# Patient Record
Sex: Female | Born: 1985 | Race: Black or African American | Hispanic: No | Marital: Single | Smoking: Never smoker
Health system: Southern US, Community
[De-identification: ages and names within clinical notes are randomized; demographics above are authoritative.]

## PROBLEM LIST (undated history)

## (undated) DIAGNOSIS — I639 Cerebral infarction, unspecified: Secondary | ICD-10-CM

---
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
## 2008-04-08 ENCOUNTER — Emergency Department (HOSPITAL_COMMUNITY): Admission: EM | Admit: 2008-04-08 | Discharge: 2008-04-08 | Payer: Self-pay | Admitting: Emergency Medicine

## 2008-05-12 ENCOUNTER — Emergency Department (HOSPITAL_COMMUNITY): Admission: EM | Admit: 2008-05-12 | Discharge: 2008-05-12 | Payer: Self-pay | Admitting: Emergency Medicine

## 2009-02-21 ENCOUNTER — Emergency Department (HOSPITAL_COMMUNITY): Admission: EM | Admit: 2009-02-21 | Discharge: 2009-02-21 | Payer: Self-pay | Admitting: Family Medicine

## 2010-09-09 ENCOUNTER — Emergency Department (HOSPITAL_COMMUNITY): Admission: EM | Admit: 2010-09-09 | Discharge: 2010-09-09 | Payer: Self-pay | Admitting: Family Medicine

## 2011-01-20 LAB — CULTURE, ROUTINE-ABSCESS

## 2011-08-05 LAB — POCT PREGNANCY, URINE
Operator id: 29452
Preg Test, Ur: NEGATIVE

## 2011-08-05 LAB — GC/CHLAMYDIA PROBE AMP, GENITAL
Chlamydia, DNA Probe: NEGATIVE
GC Probe Amp, Genital: NEGATIVE

## 2011-08-05 LAB — WET PREP, GENITAL
Trich, Wet Prep: NONE SEEN
Yeast Wet Prep HPF POC: NONE SEEN

## 2011-08-05 LAB — RPR: RPR Ser Ql: NONREACTIVE

## 2011-08-05 LAB — HERPES SIMPLEX VIRUS CULTURE

## 2012-01-18 ENCOUNTER — Emergency Department (HOSPITAL_COMMUNITY): Admission: EM | Admit: 2012-01-18 | Discharge: 2012-01-18 | Discharge status: Against Medical Advice | Payer: Self-pay

## 2014-01-14 ENCOUNTER — Inpatient Hospital Stay (HOSPITAL_COMMUNITY)
Admission: AD | Admit: 2014-01-14 | Discharge: 2014-01-14 | Disposition: A | Discharge status: Home / Self Care | Payer: BC Managed Care – PPO | Source: Ambulatory Visit | Attending: Obstetrics & Gynecology | Admitting: Obstetrics & Gynecology

## 2014-01-14 ENCOUNTER — Encounter (HOSPITAL_COMMUNITY): Payer: Self-pay

## 2014-01-14 DIAGNOSIS — T8332XA Displacement of intrauterine contraceptive device, initial encounter: Secondary | ICD-10-CM

## 2014-01-14 DIAGNOSIS — N390 Urinary tract infection, site not specified: Secondary | ICD-10-CM | POA: Insufficient documentation

## 2014-01-14 LAB — URINALYSIS, ROUTINE W REFLEX MICROSCOPIC
BILIRUBIN URINE: NEGATIVE
Glucose, UA: NEGATIVE mg/dL
Ketones, ur: NEGATIVE mg/dL
NITRITE: NEGATIVE
PROTEIN: NEGATIVE mg/dL
SPECIFIC GRAVITY, URINE: 1.02 (ref 1.005–1.030)
UROBILINOGEN UA: 0.2 mg/dL (ref 0.0–1.0)
pH: 6.5 (ref 5.0–8.0)

## 2014-01-14 LAB — POCT PREGNANCY, URINE: Preg Test, Ur: NEGATIVE

## 2014-01-14 LAB — URINE MICROSCOPIC-ADD ON

## 2014-01-14 MED ORDER — PHENAZOPYRIDINE HCL 200 MG PO TABS: 200.0000 mg | ORAL_TABLET | Freq: Three times a day (TID) | ORAL | Status: DC | PRN

## 2014-01-14 MED ORDER — CIPROFLOXACIN HCL 500 MG PO TABS: 500.0000 mg | ORAL_TABLET | Freq: Two times a day (BID) | ORAL | Status: DC

## 2014-01-14 NOTE — MAU Note (Addendum)
Pt presents complaining of painful urination and frequency that started last week and got worse yesterday. Denies vaginal bleeding or discharge. States she feels like her IUD has moved because she cannot feel the strings

## 2014-01-14 NOTE — Discharge Instructions (Signed)
Urinary Tract Infection Urinary tract infections (UTIs) can develop anywhere along your urinary tract. Your urinary tract is your body's drainage system for removing wastes and extra water. Your urinary tract includes two kidneys, two ureters, a bladder, and a urethra. Your kidneys are a pair of bean-shaped organs. Each kidney is about the size of your fist. They are located below your ribs, one on each side of your spine. CAUSES Infections are caused by microbes, which are microscopic organisms, including fungi, viruses, and bacteria. These organisms are so small that they can only be seen through a microscope. Bacteria are the microbes that most commonly cause UTIs. SYMPTOMS  Symptoms of UTIs may vary by age and gender of the patient and by the location of the infection. Symptoms in young women typically include a frequent and intense urge to urinate and a painful, burning feeling in the bladder or urethra during urination. Older women and men are more likely to be tired, shaky, and weak and have muscle aches and abdominal pain. A fever may mean the infection is in your kidneys. Other symptoms of a kidney infection include pain in your back or sides below the ribs, nausea, and vomiting. DIAGNOSIS To diagnose a UTI, your caregiver will ask you about your symptoms. Your caregiver also will ask to provide a urine sample. The urine sample will be tested for bacteria and white blood cells. White blood cells are made by your body to help fight infection. TREATMENT  Typically, UTIs can be treated with medication. Because most UTIs are caused by a bacterial infection, they usually can be treated with the use of antibiotics. The choice of antibiotic and length of treatment depend on your symptoms and the type of bacteria causing your infection. HOME CARE INSTRUCTIONS  If you were prescribed antibiotics, take them exactly as your caregiver instructs you. Finish the medication even if you feel better after you  have only taken some of the medication.  Drink enough water and fluids to keep your urine clear or pale yellow.  Avoid caffeine, tea, and carbonated beverages. They tend to irritate your bladder.  Empty your bladder often. Avoid holding urine for long periods of time.  Empty your bladder before and after sexual intercourse.  After a bowel movement, women should cleanse from front to back. Use each tissue only once. SEEK MEDICAL CARE IF:   You have back pain.  You develop a fever.  Your symptoms do not begin to resolve within 3 days. SEEK IMMEDIATE MEDICAL CARE IF:   You have severe back pain or lower abdominal pain.  You develop chills.  You have nausea or vomiting.  You have continued burning or discomfort with urination. MAKE SURE YOU:   Understand these instructions.  Will watch your condition.  Will get help right away if you are not doing well or get worse. Document Released: 08/05/2005 Document Revised: 04/26/2012 Document Reviewed: 12/04/2011 [facility] Patient Information 2014 [place], [place].  Intrauterine Device Information An intrauterine device (IUD) is inserted into your uterus to prevent pregnancy. There are two types of IUDs available:   Copper IUD This type of IUD is wrapped in copper wire and is placed inside the uterus. Copper makes the uterus and fallopian tubes produce a fluid that kills sperm. The copper IUD can stay in place for 10 years.  Hormone IUD This type of IUD contains the hormone progestin (synthetic progesterone). The hormone thickens the cervical mucus and prevents sperm from entering the uterus. It also thins the uterine  lining to prevent implantation of a fertilized egg. The hormone can weaken or kill the sperm that get into the uterus. One type of hormone IUD can stay in place for 5 years, and another type can stay in place for 3 years. Your health care provider will make sure you are a good candidate for a contraceptive IUD. Discuss with  your health care provider the possible side effects.  ADVANTAGES OF AN INTRAUTERINE DEVICE  IUDs are highly effective, reversible, long acting, and low maintenance.   There are no estrogen-related side effects.   An IUD can be used when breastfeeding.   IUDs are not associated with weight gain.   The copper IUD works immediately after insertion.   The hormone IUD works right away if inserted within 7 days of your period starting. You will need to use a backup method of birth control for 7 days if the hormone IUD is inserted at any other time in your cycle.  The copper IUD does not interfere with your female hormones.   The hormone IUD can make heavy menstrual periods lighter and decrease cramping.   The hormone IUD can be used for 3 or 5 years.   The copper IUD can be used for 10 years. DISADVANTAGES OF AN INTRAUTERINE DEVICE  The hormone IUD can be associated with irregular bleeding patterns.   The copper IUD can make your menstrual flow heavier and more painful.   You may experience cramping and vaginal bleeding after insertion.  Document Released: 09/29/2004 Document Revised: 06/28/2013 Document Reviewed: 04/16/2013 [facility] Patient Information 2014 [place]
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
## 2014-01-22 ENCOUNTER — Emergency Department (HOSPITAL_COMMUNITY)
Admission: EM | Admit: 2014-01-22 | Discharge: 2014-01-23 | Disposition: A | Discharge status: Home / Self Care | Payer: BC Managed Care – PPO | Attending: Emergency Medicine | Admitting: Emergency Medicine

## 2014-01-22 ENCOUNTER — Encounter (HOSPITAL_COMMUNITY): Payer: Self-pay | Admitting: Emergency Medicine

## 2014-01-22 DIAGNOSIS — Z3202 Encounter for pregnancy test, result negative: Secondary | ICD-10-CM | POA: Insufficient documentation

## 2014-01-22 DIAGNOSIS — K529 Noninfective gastroenteritis and colitis, unspecified: Secondary | ICD-10-CM

## 2014-01-22 DIAGNOSIS — K5289 Other specified noninfective gastroenteritis and colitis: Secondary | ICD-10-CM | POA: Insufficient documentation

## 2014-01-22 DIAGNOSIS — Z87440 Personal history of urinary (tract) infections: Secondary | ICD-10-CM | POA: Insufficient documentation

## 2014-01-22 LAB — CBC WITH DIFFERENTIAL/PLATELET
Basophils Absolute: 0 10*3/uL (ref 0.0–0.1)
Basophils Relative: 0 % (ref 0–1)
Eosinophils Absolute: 0.1 10*3/uL (ref 0.0–0.7)
Eosinophils Relative: 1 % (ref 0–5)
HCT: 42.7 % (ref 36.0–46.0)
Hemoglobin: 14.4 g/dL (ref 12.0–15.0)
Lymphocytes Relative: 18 % (ref 12–46)
Lymphs Abs: 1.4 10*3/uL (ref 0.7–4.0)
MCH: 29.6 pg (ref 26.0–34.0)
MCHC: 33.7 g/dL (ref 30.0–36.0)
MCV: 87.7 fL (ref 78.0–100.0)
Monocytes Absolute: 0.6 10*3/uL (ref 0.1–1.0)
Monocytes Relative: 8 % (ref 3–12)
Neutro Abs: 5.7 10*3/uL (ref 1.7–7.7)
Neutrophils Relative %: 74 % (ref 43–77)
Platelets: 296 10*3/uL (ref 150–400)
RBC: 4.87 MIL/uL (ref 3.87–5.11)
RDW: 12.7 % (ref 11.5–15.5)
WBC: 7.7 10*3/uL (ref 4.0–10.5)

## 2014-01-22 LAB — COMPREHENSIVE METABOLIC PANEL
ALT: 14 U/L (ref 0–35)
AST: 20 U/L (ref 0–37)
Albumin: 3.6 g/dL (ref 3.5–5.2)
Alkaline Phosphatase: 72 U/L (ref 39–117)
BUN: 10 mg/dL (ref 6–23)
CO2: 21 mEq/L (ref 19–32)
Calcium: 9.3 mg/dL (ref 8.4–10.5)
Chloride: 100 mEq/L (ref 96–112)
Creatinine, Ser: 1.14 mg/dL — ABNORMAL HIGH (ref 0.50–1.10)
GFR calc Af Amer: 75 mL/min — ABNORMAL LOW (ref >90)
GFR calc non Af Amer: 65 mL/min — ABNORMAL LOW (ref >90)
Glucose, Bld: 94 mg/dL (ref 70–99)
Potassium: 3.5 mEq/L — ABNORMAL LOW (ref 3.7–5.3)
Sodium: 136 mEq/L — ABNORMAL LOW (ref 137–147)
Total Bilirubin: 0.5 mg/dL (ref 0.3–1.2)
Total Protein: 8.1 g/dL (ref 6.0–8.3)

## 2014-01-22 LAB — URINALYSIS, ROUTINE W REFLEX MICROSCOPIC
Bilirubin Urine: NEGATIVE
Glucose, UA: NEGATIVE mg/dL
Ketones, ur: NEGATIVE mg/dL
Leukocytes, UA: NEGATIVE
Nitrite: NEGATIVE
Protein, ur: NEGATIVE mg/dL
Specific Gravity, Urine: 1.021 (ref 1.005–1.030)
Urobilinogen, UA: 0.2 mg/dL (ref 0.0–1.0)
pH: 5.5 (ref 5.0–8.0)

## 2014-01-22 LAB — PREGNANCY, URINE: Preg Test, Ur: NEGATIVE

## 2014-01-22 LAB — URINE MICROSCOPIC-ADD ON

## 2014-01-22 LAB — LIPASE, BLOOD: Lipase: 26 U/L (ref 11–59)

## 2014-01-22 MED ORDER — MORPHINE SULFATE 4 MG/ML IJ SOLN
6.0000 mg | Freq: Once | INTRAMUSCULAR | Status: AC
  Administered 2014-01-22: 6 mg via INTRAVENOUS
  Filled 2014-01-22: qty 2

## 2014-01-22 MED ORDER — SODIUM CHLORIDE 0.9 % IV BOLUS (SEPSIS)
2000.0000 mL | Freq: Once | INTRAVENOUS | Status: AC
  Administered 2014-01-22: 2000 mL via INTRAVENOUS

## 2014-01-22 MED ORDER — KETOROLAC TROMETHAMINE 30 MG/ML IJ SOLN
30.0000 mg | Freq: Once | INTRAMUSCULAR | Status: AC
  Administered 2014-01-22: 30 mg via INTRAVENOUS
  Filled 2014-01-22: qty 1

## 2014-01-22 NOTE — ED Notes (Signed)
Pt. States having a UTI since 01/14/14 and had a 3 day antibiotic and finished complete. Then since she started the medication she has had N/V/D and chills.

## 2014-01-22 NOTE — Progress Notes (Signed)
   CARE MANAGEMENT ED NOTE 01/22/2014  Patient:  Stormy CardYEARWOOD,Veryl   Account Number:  [id number]  Date Initiated:  01/22/2014  Documentation initiated by:  Radford PaxFERRERO,Flora Ratz  Subjective/Objective Assessment:   Patient presents to Ed n/v/d and chills after taking antibiotic for UTI     Subjective/Objective Assessment Detail:     Action/Plan:   Action/Plan Detail:   Anticipated DC Date:       Status Recommendation to Physician:   Result of Recommendation:    Other ED Services  Consult Working Plan    DC Planning Services  Other  PCP issues    Choice offered to / List presented to:            Status of service:  Completed, signed off  ED Comments:   ED Comments Detail:  EDCM spoke to patient at bedside.  Patient reports she does not have  a pcp but is [facility] [place].  Patient reports she has not heard back from th physicians office in about a week.  EDCM attempted to print patient a list of [employer] physicians without success.  EDCM instructed patient to call the phone number on the back of her insurance card to assist her in finding a physician who is close to her and is within network.  Patient verbalized understanding.  No further EDCM neds at this time.

## 2014-01-22 NOTE — ED Provider Notes (Signed)
CSN: [id number]     Arrival date & time 01/22/14  1705 History   First MD Initiated Contact with Patient 01/22/14 2048     Chief Complaint  Patient presents with  . Nausea  . Emesis  . Diarrhea  . Generalized Body Aches     (Consider location/radiation/quality/duration/timing/severity/associated sxs/prior Treatment) HPI The patient presents to the ER with generalized body aches, nausea, vomiting, and diarrhea since yesterday.  The patient had chills.  The patient was on antibiotics.  Last week for a urinary tract infection.  Patient denies chest pain, shortness of breath, weakness, dizziness, fever, blurred vision, headache, neck pain, abdominal distention, dysuria, back pain, or syncope.  The patient, states, that she's make her condition, better.  Patient, states, that movement makes her muscles hurt more.  Patient did not take any medications prior to arrival for her symptoms History reviewed. No pertinent past medical history. History reviewed. No pertinent past surgical history. History reviewed. No pertinent family history. History  Substance Use Topics  . Smoking status: Never Smoker   . Smokeless tobacco: Not on file  . Alcohol Use: Yes   OB History   Grav Para Term Preterm Abortions TAB SAB Ect Mult Living   0 0             Review of Systems  All other systems negative except as documented in the HPI. All pertinent positives and negatives as reviewed in the HPI.  Allergies  Review of patient's allergies indicates no known allergies.  Home Medications   Current Outpatient Rx  Name  Route  Sig  Dispense  Refill  . ibuprofen (ADVIL,MOTRIN) 200 MG tablet   Oral   Take 600 mg by mouth every 6 (six) hours as needed (pain). pain         . levonorgestrel (MIRENA) 20 MCG/24HR IUD   Intrauterine   1 each by Intrauterine route once.          BP 122/79  Pulse 93  Temp(Src) 98.4 F (36.9 C) (Oral)  Resp 16  SpO2 100%  LMP 01/07/2014 Physical Exam  Nursing note  and vitals reviewed. Constitutional: She is oriented to person, place, and time. She appears well-developed and well-nourished. No distress.  HENT:  Head: Normocephalic and atraumatic.  Mouth/Throat: Oropharynx is clear and moist.  Eyes: Pupils are equal, round, and reactive to light.  Neck: Normal range of motion. Neck supple.  Cardiovascular: Normal rate, regular rhythm and normal heart sounds.  Exam reveals no gallop and no friction rub.   No murmur heard. Pulmonary/Chest: Effort normal and breath sounds normal. No respiratory distress. She has no wheezes.  Abdominal: Soft. Bowel sounds are normal. She exhibits no distension. There is no tenderness. There is no guarding.  Neurological: She is alert and oriented to person, place, and time.  Skin: Skin is warm and dry. No rash noted. No erythema.    ED Course  Procedures (including critical care time) Labs Review Labs Reviewed  COMPREHENSIVE METABOLIC PANEL - Abnormal; Notable for the following:    Sodium 136 (*)    Potassium 3.5 (*)    Creatinine, Ser 1.14 (*)    GFR calc non Af Amer 65 (*)    GFR calc Af Amer 75 (*)    All other components within normal limits  URINALYSIS, ROUTINE W REFLEX MICROSCOPIC - Abnormal; Notable for the following:    Hgb urine dipstick TRACE (*)    All other components within normal limits  URINE CULTURE  CBC WITH DIFFERENTIAL  LIPASE, BLOOD  PREGNANCY, URINE  URINE MICROSCOPIC-ADD ON   patient is feeling has improved after IV fluids and pain.  Medications.  The patient is advised to slowly increase her fluid intake, and return here as needed.  Patient most likely has gastroenteritis, based on her history of present illness, and physical exam findings, along with her laboratory testing.  No imaging of her abdomen was not done at this time due to the fact that she did not have significant abdominal discomfort she is advised to follow up with her primary care Dr. told to return here as  needed  Carlyle DollyChristopher W Nikolette Reindl, PA-C 01/23/14 0012

## 2014-01-23 MED ORDER — HYDROCODONE-ACETAMINOPHEN 5-325 MG PO TABS: 1.0000 | ORAL_TABLET | Freq: Four times a day (QID) | ORAL | Status: DC | PRN

## 2014-01-23 MED ORDER — PROMETHAZINE HCL 25 MG PO TABS: 25.0000 mg | ORAL_TABLET | Freq: Three times a day (TID) | ORAL | Status: DC | PRN

## 2014-01-23 NOTE — ED Provider Notes (Signed)
Medical screening examination/treatment/procedure(s) were performed by non-physician practitioner and as supervising physician I was immediately available for consultation/collaboration.   EKG Interpretation None        Gilda Creasehristopher J. Marlisa Caridi, MD 01/23/14 [phone]

## 2014-01-23 NOTE — Discharge Instructions (Signed)
Return here as needed.  Followup with a primary care Dr. slowly increase your fluid intake

## 2014-01-24 LAB — URINE CULTURE: Colony Count: 45000

## 2014-05-07 DIAGNOSIS — I639 Cerebral infarction, unspecified: Secondary | ICD-10-CM

## 2014-05-07 HISTORY — DX: Cerebral infarction, unspecified: I63.9

## 2014-05-09 ENCOUNTER — Inpatient Hospital Stay (HOSPITAL_COMMUNITY): Payer: Self-pay

## 2014-05-09 ENCOUNTER — Emergency Department (HOSPITAL_COMMUNITY): Payer: Self-pay

## 2014-05-09 ENCOUNTER — Inpatient Hospital Stay (HOSPITAL_COMMUNITY)
Admission: EM | Admit: 2014-05-09 | Discharge: 2014-05-12 | DRG: 065 | Disposition: A | Discharge status: Home Health | Payer: Self-pay | Attending: Internal Medicine | Admitting: Internal Medicine

## 2014-05-09 ENCOUNTER — Encounter (HOSPITAL_COMMUNITY): Payer: Self-pay | Admitting: Emergency Medicine

## 2014-05-09 DIAGNOSIS — R269 Unspecified abnormalities of gait and mobility: Secondary | ICD-10-CM | POA: Diagnosis present

## 2014-05-09 DIAGNOSIS — G8190 Hemiplegia, unspecified affecting unspecified side: Secondary | ICD-10-CM | POA: Diagnosis present

## 2014-05-09 DIAGNOSIS — I6340 Cerebral infarction due to embolism of unspecified cerebral artery: Principal | ICD-10-CM

## 2014-05-09 DIAGNOSIS — I63411 Cerebral infarction due to embolism of right middle cerebral artery: Secondary | ICD-10-CM

## 2014-05-09 DIAGNOSIS — R42 Dizziness and giddiness: Secondary | ICD-10-CM | POA: Diagnosis present

## 2014-05-09 DIAGNOSIS — R209 Unspecified disturbances of skin sensation: Secondary | ICD-10-CM | POA: Diagnosis present

## 2014-05-09 DIAGNOSIS — Z6835 Body mass index (BMI) 35.0-35.9, adult: Secondary | ICD-10-CM

## 2014-05-09 DIAGNOSIS — Z975 Presence of (intrauterine) contraceptive device: Secondary | ICD-10-CM

## 2014-05-09 DIAGNOSIS — N179 Acute kidney failure, unspecified: Secondary | ICD-10-CM

## 2014-05-09 DIAGNOSIS — R262 Difficulty in walking, not elsewhere classified: Secondary | ICD-10-CM | POA: Diagnosis present

## 2014-05-09 DIAGNOSIS — N178 Other acute kidney failure: Secondary | ICD-10-CM

## 2014-05-09 DIAGNOSIS — E669 Obesity, unspecified: Secondary | ICD-10-CM

## 2014-05-09 LAB — CBC WITH DIFFERENTIAL/PLATELET
Basophils Absolute: 0 10*3/uL (ref 0.0–0.1)
Basophils Relative: 0 % (ref 0–1)
Eosinophils Absolute: 0.1 10*3/uL (ref 0.0–0.7)
Eosinophils Relative: 2 % (ref 0–5)
HCT: 43.1 % (ref 36.0–46.0)
Hemoglobin: 14.7 g/dL (ref 12.0–15.0)
Lymphocytes Relative: 48 % — ABNORMAL HIGH (ref 12–46)
Lymphs Abs: 3.2 10*3/uL (ref 0.7–4.0)
MCH: 29.6 pg (ref 26.0–34.0)
MCHC: 34.1 g/dL (ref 30.0–36.0)
MCV: 86.9 fL (ref 78.0–100.0)
Monocytes Absolute: 0.5 10*3/uL (ref 0.1–1.0)
Monocytes Relative: 8 % (ref 3–12)
Neutro Abs: 2.9 10*3/uL (ref 1.7–7.7)
Neutrophils Relative %: 42 % — ABNORMAL LOW (ref 43–77)
Platelets: 257 10*3/uL (ref 150–400)
RBC: 4.96 MIL/uL (ref 3.87–5.11)
RDW: 13.1 % (ref 11.5–15.5)
WBC: 6.7 10*3/uL (ref 4.0–10.5)

## 2014-05-09 LAB — BASIC METABOLIC PANEL
Anion gap: 13 (ref 5–15)
BUN: 10 mg/dL (ref 6–23)
CO2: 22 mEq/L (ref 19–32)
Calcium: 8.9 mg/dL (ref 8.4–10.5)
Chloride: 101 mEq/L (ref 96–112)
Creatinine, Ser: 1.28 mg/dL — ABNORMAL HIGH (ref 0.50–1.10)
GFR calc Af Amer: 65 mL/min — ABNORMAL LOW (ref >90)
GFR calc non Af Amer: 56 mL/min — ABNORMAL LOW (ref >90)
Glucose, Bld: 83 mg/dL (ref 70–99)
Potassium: 4.1 mEq/L (ref 3.7–5.3)
Sodium: 136 mEq/L — ABNORMAL LOW (ref 137–147)

## 2014-05-09 LAB — PREGNANCY, URINE: Preg Test, Ur: NEGATIVE

## 2014-05-09 LAB — HOMOCYSTEINE: HOMOCYSTEINE-NORM: 8.8 umol/L (ref 4.0–15.4)

## 2014-05-09 MED ORDER — ASPIRIN 300 MG RE SUPP: 300.0000 mg | Freq: Every day | RECTAL | Status: DC

## 2014-05-09 MED ORDER — HYDROCODONE-ACETAMINOPHEN 5-325 MG PO TABS
1.0000 | ORAL_TABLET | ORAL | Status: DC | PRN
  Administered 2014-05-11: 1 via ORAL
  Filled 2014-05-09: qty 1

## 2014-05-09 MED ORDER — STROKE: EARLY STAGES OF RECOVERY BOOK
Freq: Once | Status: DC
  Filled 2014-05-09 (×2): qty 1

## 2014-05-09 MED ORDER — ONDANSETRON HCL 4 MG/2ML IJ SOLN
INTRAMUSCULAR | Status: AC
  Filled 2014-05-09: qty 2

## 2014-05-09 MED ORDER — ONDANSETRON HCL 4 MG/2ML IJ SOLN
4.0000 mg | Freq: Once | INTRAMUSCULAR | Status: AC
  Administered 2014-05-09: 4 mg via INTRAVENOUS
  Filled 2014-05-09: qty 2

## 2014-05-09 MED ORDER — SENNOSIDES-DOCUSATE SODIUM 8.6-50 MG PO TABS
1.0000 | ORAL_TABLET | Freq: Every evening | ORAL | Status: DC | PRN
Start: 2014-05-09 — End: 2014-05-12

## 2014-05-09 MED ORDER — ACETAMINOPHEN 325 MG PO TABS
650.0000 mg | ORAL_TABLET | ORAL | Status: DC | PRN
  Administered 2014-05-09 – 2014-05-11 (×7): 650 mg via ORAL
  Filled 2014-05-09 (×7): qty 2

## 2014-05-09 MED ORDER — ACETAMINOPHEN 650 MG RE SUPP: 650.0000 mg | RECTAL | Status: DC | PRN

## 2014-05-09 MED ORDER — MORPHINE SULFATE 4 MG/ML IJ SOLN
4.0000 mg | Freq: Once | INTRAMUSCULAR | Status: AC
  Administered 2014-05-09: 4 mg via INTRAVENOUS
  Filled 2014-05-09: qty 1

## 2014-05-09 MED ORDER — ENOXAPARIN SODIUM 40 MG/0.4ML ~~LOC~~ SOLN
40.0000 mg | SUBCUTANEOUS | Status: DC
  Administered 2014-05-10 – 2014-05-11 (×2): 40 mg via SUBCUTANEOUS
  Filled 2014-05-09 (×3): qty 0.4

## 2014-05-09 MED ORDER — KETOROLAC TROMETHAMINE 15 MG/ML IJ SOLN
15.0000 mg | Freq: Once | INTRAMUSCULAR | Status: AC
  Administered 2014-05-09: 15 mg via INTRAVENOUS
  Filled 2014-05-09: qty 1

## 2014-05-09 MED ORDER — ASPIRIN 325 MG PO TABS
325.0000 mg | ORAL_TABLET | Freq: Every day | ORAL | Status: DC
  Administered 2014-05-10 – 2014-05-12 (×3): 325 mg via ORAL
  Filled 2014-05-09 (×3): qty 1

## 2014-05-09 NOTE — Progress Notes (Signed)
Patient arrived to the unit.

## 2014-05-09 NOTE — ED Notes (Signed)
Patient transported to CT 

## 2014-05-09 NOTE — Consult Note (Signed)
Referring Physician: Ardyth Harps    Chief Complaint: stroke  HPI:                                                                                                                                         Kristina [name] is an 28 y.o. female who noted on Monday to have a headache located bilateral frontal region.  This was followed by a sensation something was crawling on her right arm.  She later tells me that it was her left arm touching her right arm but she did not realize she was touching her arm. She did not seek medical attention at that time.  On Tuesday she continued to have a HA associated with dizziness and weak left hand grip. Today she noted above symptoms and difficulty walking as she would vere to the left. She decided to seek medical attention today for the above symptoms. Patient obtained a CT head at [facility] ED which showed acute infarct in the right insular cortex at the level of basal ganglia. Patient was transferred to [facility] hospital.   Date last known well: Date: 05/07/2014 Time last known well: Unable to determine tPA Given: No: out of window  History reviewed. No pertinent past medical history.  History reviewed. No pertinent past surgical history.  Family History  Problem Relation Age of Onset  . High blood pressure Mother   . Glaucoma Mother    Social History:  reports that she has never smoked. She has never used smokeless tobacco. She reports that she drinks alcohol. She reports that she does not use illicit drugs.  Allergies: No Known Allergies  Medications:                                                                                                                           Prior to Admission:  Prescriptions prior to admission  Medication Sig Dispense Refill  . ibuprofen (ADVIL,MOTRIN) 200 MG tablet Take 600 mg by mouth every 6 (six) hours as needed (pain). pain      . ibuprofen (ADVIL,MOTRIN) 200 MG tablet Take 800 mg by mouth every 6 (six) hours as needed for  moderate pain.      Marland Kitchen levonorgestrel (MIRENA) 20 MCG/24HR IUD 1 each by Intrauterine route once.       Scheduled: .  stroke: mapping our early stages of recovery book  Does not apply Once  . aspirin  300 mg Rectal Daily   Or  . aspirin  325 mg Oral Daily  . enoxaparin (LOVENOX) injection  40 mg Subcutaneous Q24H    ROS:                                                                                                                                       History obtained from the patient  General ROS: negative for - chills, fatigue, fever, night sweats, weight gain or weight loss Psychological ROS: negative for - behavioral disorder, hallucinations, memory difficulties, mood swings or suicidal ideation Ophthalmic ROS: negative for - blurry vision, double vision, eye pain or loss of vision ENT ROS: negative for - epistaxis, nasal discharge, oral lesions, sore throat, tinnitus or vertigo Allergy and Immunology ROS: negative for - hives or itchy/watery eyes Hematological and Lymphatic ROS: negative for - bleeding problems, bruising or swollen lymph nodes Endocrine ROS: negative for - galactorrhea, hair pattern changes, polydipsia/polyuria or temperature intolerance Respiratory ROS: negative for - cough, hemoptysis, shortness of breath or wheezing Cardiovascular ROS: negative for - chest pain, dyspnea on exertion, edema or irregular heartbeat Gastrointestinal ROS: negative for - abdominal pain, diarrhea, hematemesis, nausea/vomiting or stool incontinence Genito-Urinary ROS: negative for - dysuria, hematuria, incontinence or urinary frequency/urgency Musculoskeletal ROS: negative for - joint swelling or muscular weakness Neurological ROS: as noted in HPI Dermatological ROS: negative for rash and skin lesion changes  Neurologic Examination:                                                                                                      Blood pressure 113/77, pulse 60, temperature 98.6 F  (37 C), temperature source Oral, resp. rate 16, last menstrual period 05/02/2014, SpO2 100.00%.  Mental Status: Alert, oriented, thought content appropriate.  Speech fluent without evidence of aphasia.  Able to follow 3 step commands without difficulty. Cranial Nerves: II: Discs flat bilaterally; Visual fields grossly normal, pupils equal, round, reactive to light and accommodation III,IV, VI: ptosis not present, extra-ocular motions intact bilaterally V,VII: smile symmetric, facial light touch sensation normal bilaterally VIII: hearing normal bilaterally IX,X: gag reflex present XI: bilateral shoulder shrug XII: midline tongue extension without atrophy or fasciculations  Motor: Right : Upper extremity   5/5    Left:     Upper extremity   5/5  Lower extremity   5/5     Lower extremity   4/5 --left lower extremity showed proximal weakness  with a component of give way strength with hip flexion and DF/PF Tone and bulk:normal tone throughout; no atrophy noted Sensory: Pinprick and light touch intact throughout, bilaterally--but she neglected her left leg with DSS Deep Tendon Reflexes:  Right: Upper Extremity   Left: Upper extremity   biceps (C-5 to C-6) 2/4   biceps (C-5 to C-6) 2/4 tricep (C7) 2/4    triceps (C7) 2/4 Brachioradialis (C6) 2/4  Brachioradialis (C6) 2/4  Lower Extremity Lower Extremity  quadriceps (L-2 to L-4) 2/4   quadriceps (L-2 to L-4) 2/4 Achilles (S1) 2/4   Achilles (S1) 2/4  Plantars: Right: downgoing   Left: downgoing Cerebellar: normal finger-to-nose,  normal heel-to-shin test Gait: not tested.  CV: pulses palpable throughout    Lab Results: Basic Metabolic Panel:  Recent Labs Lab 05/09/14 1026  NA 136*  K 4.1  CL 101  CO2 22  GLUCOSE 83  BUN 10  CREATININE 1.28*  CALCIUM 8.9    Liver Function Tests: No results found for this basename: AST, ALT, ALKPHOS, BILITOT, PROT, ALBUMIN,  in the last 168 hours No results found for this basename:  LIPASE, AMYLASE,  in the last 168 hours No results found for this basename: AMMONIA,  in the last 168 hours  CBC:  Recent Labs Lab 05/09/14 1026  WBC 6.7  NEUTROABS 2.9  HGB 14.7  HCT 43.1  MCV 86.9  PLT 257    Cardiac Enzymes: No results found for this basename: CKTOTAL, CKMB, CKMBINDEX, TROPONINI,  in the last 168 hours  Lipid Panel: No results found for this basename: CHOL, TRIG, HDL, CHOLHDL, VLDL, LDLCALC,  in the last 168 hours  CBG: No results found for this basename: GLUCAP,  in the last 168 hours  Microbiology: Results for orders placed during the hospital encounter of 01/22/14  URINE CULTURE     Status: None   Collection Time    01/22/14  9:27 PM      Result Value Ref Range Status   Specimen Description URINE, CLEAN CATCH   Final   Special Requests NONE   Final   Culture  Setup Time     Final   Value: 01/23/2014 02:45     Performed at [facility] Count     Final   Value: 45,000 COLONIES/ML     Performed at [facility]   Culture     Final   Value: Multiple bacterial morphotypes present, none predominant. Suggest appropriate recollection if clinically indicated.     Performed at [facility]   Report Status 01/24/2014 FINAL   Final    Coagulation Studies: No results found for this basename: LABPROT, INR,  in the last 72 hours  Imaging: Dg Chest 2 View  05/09/2014   CLINICAL DATA:  28 year old female with headache and dizziness. Abnormal right insula. Initial encounter.  EXAM: CHEST  2 VIEW  COMPARISON:  None.  FINDINGS: Mildly low lung volumes. Normal cardiac size and mediastinal contours. Visualized tracheal air column is within normal limits. Other than mild crowding of markings, the lungs are clear. No pneumothorax or effusion. Except for mild scoliosis negative visualized osseous structures.  IMPRESSION: Low lung volumes, otherwise negative.   Electronically Signed   By: Augusto Gamble M.D.   On: 05/09/2014 15:25   Ct Head  Wo Contrast  05/09/2014   CLINICAL DATA:  Headache and dizziness ; left-sided numbness  EXAM: CT HEAD WITHOUT CONTRAST  TECHNIQUE: Contiguous axial images were obtained from  the base of the skull through the vertex without intravenous contrast.  COMPARISON:  None.  FINDINGS: The ventricles are normal in size and configuration. There is no appreciable mass, hemorrhage, extra-axial fluid collection, or midline shift.  There is decreased attenuation in the region of the insular cortex on the right at the level of the right basal ganglia. This finding is felt to represent an acute infarct in this region. It is best seen on axial slices 11, 12, 13, and 14. Elsewhere gray-white compartments appear normal.  Bony calvarium appears intact. The mastoid air cells are clear. There is an air-fluid level in the superior right maxillary antrum.  IMPRESSION: Findings felt to represent acute infarct the region of the insular cortex on the right at the level of the basal ganglia. No hemorrhage or mass effect. Air-fluid level in right maxillary antrum.   Electronically Signed   By: Bretta BangWilliam  Woodruff M.D.   On: 05/09/2014 11:12    Felicie MornDavid Smith PA-C [facility] [phone]  05/09/2014, 5:23 PM  Patient seen and examined.  Clinical course and management discussed.  Necessary edits performed.  I agree with the above.  Assessment and plan of care developed and discussed below.     Assessment: 28 y.o. female with complaints of dizziness and left hand and leg weakness.  CT head reviewed and shows an area of hypodensity in the right insular cortex.  Acute infarct is suspected.  Further work up recommended.  There is concern for her young age as to etiology.     Stroke Risk Factors - none  1. HgbA1c, fasting lipid panel  2. MRI, MRA  of the brain without contrast  3. PT consult, OT consult, Speech consult  4. Echocardiogram   5. Carotid dopplers   6. Prophylactic therapy-Antiplatelet med: Aspirin - dose 325 mg  daily 7. Would discuss contraceptive methods.  Current hormonal therapy may be a contributing factor.   8. Telemetry monitoring 9. Frequent neuro checks 10. Hypercoagulable panel      Thana FarrLeslie Sarrah Fiorenza, MD [facility] [phone]  05/09/2014  7:01 PM

## 2014-05-09 NOTE — ED Notes (Signed)
Patient observed walking to the bathroom prior to assessment without any difficulty or assistance.

## 2014-05-09 NOTE — ED Provider Notes (Signed)
CSN: [id number]     Arrival date & time 05/09/14  0919 History   First MD Initiated Contact with Patient 05/09/14 [phone]     Chief Complaint  Patient presents with  . Dizziness     (Consider location/radiation/quality/duration/timing/severity/associated sxs/prior Treatment) HPI  28 year old female presenting with a chief complaint what she calls dizziness. She describes a sensation of feeling off balance. She feels like she is falling to her left side when she ambulates. She's having numbness in her left arm and left leg. Her left arm and leg feel weak. She has dropped things from her left hand. Symptom onset was approximately 3 days ago. Constant. No significant progression or improvement. Mild diffuse headache. Reports at symptom onset she had some blurred vision, but this has since resolved. No history similar symptoms. No significant past medical history.  History reviewed. No pertinent past medical history. History reviewed. No pertinent past surgical history. History reviewed. No pertinent family history. History  Substance Use Topics  . Smoking status: Never Smoker   . Smokeless tobacco: Not on file  . Alcohol Use: Yes     Comment: occ   OB History   Grav Para Term Preterm Abortions TAB SAB Ect Mult Living   0 0             Review of Systems  All systems reviewed and negative, other than as noted in HPI.   Allergies  Review of patient's allergies indicates no known allergies.  Home Medications   Prior to Admission medications   Medication Sig Start Date End Date Taking? Authorizing Provider  HYDROcodone-acetaminophen (NORCO/VICODIN) 5-325 MG per tablet Take 1 tablet by mouth every 6 (six) hours as needed for moderate pain. 01/23/14   Jamesetta Orleanshristopher W Lawyer, PA-C  ibuprofen (ADVIL,MOTRIN) 200 MG tablet Take 600 mg by mouth every 6 (six) hours as needed (pain). pain    Historical Provider, MD  levonorgestrel (MIRENA) 20 MCG/24HR IUD 1 each by Intrauterine route once.     Historical Provider, MD  promethazine (PHENERGAN) 25 MG tablet Take 1 tablet (25 mg total) by mouth every 8 (eight) hours as needed for nausea or vomiting. 01/23/14   Jamesetta Orleanshristopher W Lawyer, PA-C   BP 122/83  Pulse 80  Temp(Src) 98.6 F (37 C) (Oral)  Resp 20  SpO2 100%  LMP 05/02/2014 Physical Exam  Nursing note and vitals reviewed. Constitutional: She appears well-developed and well-nourished. No distress.  HENT:  Head: Normocephalic and atraumatic.  Eyes: Conjunctivae are normal. Right eye exhibits no discharge. Left eye exhibits no discharge.  Neck: Neck supple.  Cardiovascular: Normal rate, regular rhythm and normal heart sounds.  Exam reveals no gallop and no friction rub.   No murmur heard. Pulmonary/Chest: Effort normal and breath sounds normal. No respiratory distress.  Abdominal: Soft. She exhibits no distension. There is no tenderness.  Musculoskeletal: She exhibits no edema and no tenderness.  Neurological: She is alert.  CN intact. Strength 4/5 L U/L extremity. 5/5 R side. Finger to nose normal b/l. Reports decreased sensation to light touch L upper and lower extremities. Gait slightly antalgic.   Skin: Skin is warm and dry.  Psychiatric: She has a normal mood and affect. Her behavior is normal. Thought content normal.    ED Course  Procedures (including critical care time) Labs Review Labs Reviewed - No data to display  Imaging Review No results found.   EKG Interpretation   Date/Time:  Wednesday May 09 2014 09:34:07 EDT Ventricular Rate:  75 PR Interval:  135 QRS Duration: 88 QT Interval:  409 QTC Calculation: 457 R Axis:   68 Text Interpretation:  Sinus rhythm Non-specific ST-t changes No old  tracing to compare Confirmed by Zeven Kocak  MD, Moriyah Byington ([id number]) on 05/09/2014  9:37:15 AM      MDM   Final diagnoses:  Cerebral infarction due to embolism of right middle cerebral artery  Acute renal failure with other specified pathological lesion in kidney     27yf with L sided weakness. CT with infarct R cerebral artery territory. Symptom onset 3 days ago. Discussed with neurology. [facility].     Raeford RazorStephen Madolyn Ackroyd, MD 05/09/14 1539

## 2014-05-09 NOTE — H&P (Signed)
[facility]          History and Physical    PCP:   No primary provider on file.   Chief Complaint:  Dizziness, gait imbalance, decreased left sided grip strength and numbness  HPI: Patient is a pleasant 28 year old young lady without significant past medical history who states that Monday afternoon (2 days prior to admission) she developed a significant headache which evolved into dizziness. She later states that she felt like things were crawling on her left arm. She mentioned this to her mother who told her to take a shower which she did and then went to bed. She awoke Tuesday morning and noticed that the dizziness remained and she also noticed decreased left grip strength to where she was unable to open doorknobs and also noticed that she was falling to the left side. She decides to come into the hospital today for evaluation where a CT scan of the head shows an acute infarct of the insular cortex on the right at the level of the basal ganglia without hemorrhage or mass effect. Neurology has been called and they're requesting transport to [facility]. Hospitalist admission has been requested.  Allergies:  No Known Allergies   History reviewed. No pertinent past medical history.  History reviewed. No pertinent past surgical history.  Prior to Admission medications   Medication Sig Start Date End Date Taking? Authorizing Provider  ibuprofen (ADVIL,MOTRIN) 200 MG tablet Take 600 mg by mouth every 6 (six) hours as needed (pain). pain   Yes Historical Provider, MD  ibuprofen (ADVIL,MOTRIN) 200 MG tablet Take 800 mg by mouth every 6 (six) hours as needed for moderate pain.   Yes Historical Provider, MD  levonorgestrel (MIRENA) 20 MCG/24HR IUD 1 each by Intrauterine route once.   Yes Historical Provider, MD    Social History:  reports that she has never smoked. She has never used smokeless tobacco. She reports that she drinks alcohol. She reports that she does not use  illicit drugs.  Family History  Problem Relation Age of Onset  . High blood pressure Mother   . Glaucoma Mother     Review of Systems:  Constitutional: Denies fever, chills, diaphoresis, appetite change and fatigue.  HEENT: Denies photophobia, eye pain, redness, hearing loss, ear pain, congestion, sore throat, rhinorrhea, sneezing, mouth sores, trouble swallowing, neck pain, neck stiffness and tinnitus.   Respiratory: Denies SOB, DOE, cough, chest tightness,  and wheezing.   Cardiovascular: Denies chest pain, palpitations and leg swelling.  Gastrointestinal: Denies nausea, vomiting, abdominal pain, diarrhea, constipation, blood in stool and abdominal distention.  Genitourinary: Denies dysuria, urgency, frequency, hematuria, flank pain and difficulty urinating.  Endocrine: Denies: hot or cold intolerance, sweats, changes in hair or nails, polyuria, polydipsia. Musculoskeletal: Denies myalgias, back pain, joint swelling, arthralgias and gait problem.  Skin: Denies pallor, rash and wound.  Neurological: Denies  seizures, syncope. Hematological: Denies adenopathy. Easy bruising, personal or family bleeding history  Psychiatric/Behavioral: Denies suicidal ideation, mood changes, confusion, nervousness, sleep disturbance and agitation   Physical Exam: Blood pressure 112/99, pulse 89, temperature 98.2 F (36.8 C), temperature source Oral, resp. rate 16, last menstrual period 05/02/2014, SpO2 100.00%. General: Alert, awake, oriented x3, no distress. HEENT: Normocephalic, atraumatic, pupils are reactive to light, extraocular movements intact. Cardiovascular: Regular rate and rhythm, no murmurs, rubs or gallops. Lungs: Clear to auscultation bilaterally. Abdomen: Soft, nontender, nondistended, positive bowel sounds, no masses or organomegaly  noted. Extremities: No clubbing, cyanosis or edema, positive pedal pulses. Neurologic: Mental status intact, cranial nerve II through XII intact, decreased  sensation to light touch to left upper extremity, finger to nose sluggish on the left, I have not ambulated her, plantar reflex is downgoing bilaterally.  Labs on Admission:  Results for orders placed during the hospital encounter of 05/09/14 (from the past 48 hour(s))  PREGNANCY, URINE     Status: None   Collection Time    05/09/14 10:13 AM      Result Value Ref Range   Preg Test, Ur NEGATIVE  NEGATIVE   Comment:            THE SENSITIVITY OF THIS     METHODOLOGY IS >20 mIU/mL.  CBC WITH DIFFERENTIAL     Status: Abnormal   Collection Time    05/09/14 10:26 AM      Result Value Ref Range   WBC 6.7  4.0 - 10.5 K/uL   RBC 4.96  3.87 - 5.11 MIL/uL   Hemoglobin 14.7  12.0 - 15.0 g/dL   HCT 43.1  36.0 - 46.0 %   MCV 86.9  78.0 - 100.0 fL   MCH 29.6  26.0 - 34.0 pg   MCHC 34.1  30.0 - 36.0 g/dL   RDW 13.1  11.5 - 15.5 %   Platelets 257  150 - 400 K/uL   Neutrophils Relative % 42 (*) 43 - 77 %   Neutro Abs 2.9  1.7 - 7.7 K/uL   Lymphocytes Relative 48 (*) 12 - 46 %   Lymphs Abs 3.2  0.7 - 4.0 K/uL   Monocytes Relative 8  3 - 12 %   Monocytes Absolute 0.5  0.1 - 1.0 K/uL   Eosinophils Relative 2  0 - 5 %   Eosinophils Absolute 0.1  0.0 - 0.7 K/uL   Basophils Relative 0  0 - 1 %   Basophils Absolute 0.0  0.0 - 0.1 K/uL  BASIC METABOLIC PANEL     Status: Abnormal   Collection Time    05/09/14 10:26 AM      Result Value Ref Range   Sodium 136 (*) 137 - 147 mEq/L   Potassium 4.1  3.7 - 5.3 mEq/L   Chloride 101  96 - 112 mEq/L   CO2 22  19 - 32 mEq/L   Glucose, Bld 83  70 - 99 mg/dL   BUN 10  6 - 23 mg/dL   Creatinine, Ser 1.28 (*) 0.50 - 1.10 mg/dL   Calcium 8.9  8.4 - 10.5 mg/dL   GFR calc non Af Amer 56 (*) >90 mL/min   GFR calc Af Amer 65 (*) >90 mL/min   Comment: (NOTE)     The eGFR has been calculated using the CKD EPI equation.     This calculation has not been validated in all clinical situations.     eGFR's persistently <90 mL/min signify possible Chronic Kidney      Disease.   Anion gap 13  5 - 15    Radiological Exams on Admission: Ct Head Wo Contrast  05/09/2014   CLINICAL DATA:  Headache and dizziness ; left-sided numbness  EXAM: CT HEAD WITHOUT CONTRAST  TECHNIQUE: Contiguous axial images were obtained from the base of the skull through the vertex without intravenous contrast.  COMPARISON:  None.  FINDINGS: The ventricles are normal in size and configuration. There is no appreciable mass, hemorrhage, extra-axial fluid collection, or midline shift.  There  is decreased attenuation in the region of the insular cortex on the right at the level of the right basal ganglia. This finding is felt to represent an acute infarct in this region. It is best seen on axial slices 11, 12, 13, and 14. Elsewhere gray-white compartments appear normal.  Bony calvarium appears intact. The mastoid air cells are clear. There is an air-fluid level in the superior right maxillary antrum.  IMPRESSION: Findings felt to represent acute infarct the region of the insular cortex on the right at the level of the basal ganglia. No hemorrhage or mass effect. Air-fluid level in right maxillary antrum.   Electronically Signed   By: Lowella Grip M.D.   On: 05/09/2014 11:12    Assessment/Plan Principal Problem:   CVA (cerebral infarction) Active Problems:   ARF (acute renal failure)    Acute CVA -In a very young patient. -Will admit to telemetry in order to obtain MRI, carotid Dopplers, 2-D echo. -In addition will order a hypercoagulable panel and initial vasculitic workup with an ANA. -She has a Mirena IUD and consideration will need to be given for this to be removed. -Have discussed case with neurology, Dr. Doy Mince, who will see patient in consultation when she arrives to [facility]. -Start aspirin for secondary stroke prophylaxis.  Acute renal failure  -Likely prerenal azotemia. -Recheck tomorrow morning after some IV fluids.  DVT prophylaxis -Lovenox  CODE  STATUS Full code   Time Spent on Admission: 75 minutes  [facility] Pager: [phone] 05/09/2014, 1:37 PM

## 2014-05-09 NOTE — Progress Notes (Signed)
[facility] provided pt with a list of primary care resources and a [facility] [employer] Card application to help patient establish primary care.  °

## 2014-05-09 NOTE — ED Notes (Addendum)
Pt c/o headache, dizziness, blurred vision, and decreased sensation in L hand x 2 days.  Pain score 7/10.  Pt reports that headache is worse when she wakes up.  Sts "when I walk, it feels like the R side is doing more work."  Blurred vision has resolved.

## 2014-05-09 NOTE — ED Notes (Signed)
[facility] at bedside 

## 2014-05-10 DIAGNOSIS — I6350 Cerebral infarction due to unspecified occlusion or stenosis of unspecified cerebral artery: Secondary | ICD-10-CM

## 2014-05-10 DIAGNOSIS — G8110 Spastic hemiplegia affecting unspecified side: Secondary | ICD-10-CM

## 2014-05-10 DIAGNOSIS — I6330 Cerebral infarction due to thrombosis of unspecified cerebral artery: Secondary | ICD-10-CM

## 2014-05-10 LAB — LUPUS ANTICOAGULANT PANEL
DRVVT: 36.3 secs (ref <42.9)
Lupus Anticoagulant: NOT DETECTED
PTT LA: 37.8 s (ref 28.0–43.0)

## 2014-05-10 LAB — PROTEIN S ACTIVITY: Protein S Activity: 81 % (ref 69–129)

## 2014-05-10 LAB — MRSA PCR SCREENING: MRSA by PCR: NEGATIVE

## 2014-05-10 LAB — ANA: ANA: NEGATIVE

## 2014-05-10 LAB — LIPID PANEL
Cholesterol: 132 mg/dL (ref 0–200)
HDL: 41 mg/dL (ref >39)
LDL CALC: 74 mg/dL (ref 0–99)
Total CHOL/HDL Ratio: 3.2 RATIO
Triglycerides: 86 mg/dL (ref <150)
VLDL: 17 mg/dL (ref 0–40)

## 2014-05-10 LAB — ANTITHROMBIN III: AntiThromb III Func: 97 % (ref 75–120)

## 2014-05-10 LAB — CARDIOLIPIN ANTIBODIES, IGG, IGM, IGA
ANTICARDIOLIPIN IGA: 5 U/mL — AB (ref <22)
ANTICARDIOLIPIN IGG: 7 GPL U/mL — AB (ref <23)
Anticardiolipin IgM: 5 MPL U/mL — ABNORMAL LOW (ref <11)

## 2014-05-10 LAB — PROTEIN C ACTIVITY: PROTEIN C ACTIVITY: 142 % — AB (ref 75–133)

## 2014-05-10 LAB — HEMOGLOBIN A1C
Hgb A1c MFr Bld: 5.6 % (ref <5.7)
Mean Plasma Glucose: 114 mg/dL (ref <117)

## 2014-05-10 NOTE — Progress Notes (Signed)
[facility] PROGRESS NOTE  Kristina [name] WUJ:[id number] DOB: 15-Jan-1986 DOA: 05/09/2014 PCP: No primary provider on file.  Assessment/Plan: Acute RCA CVA  -check Carotid Dopplers, 2-D echo.  -FU hypercoagulable panel, ANA negative -Urine pregnancy negative -I have advised her to get [facility], [facility] -Neuro following -ASA 325mg  daily, FU LDL, hbaic -PT/OT eval  Acute renal failure  -Likely prerenal azotemia.  -Hydrate, IVF  DVT prophylaxis  -Lovenox  Code Status: Full Code Family Communication: none at bedside Disposition Plan: ?CIR   Consultants:  NEuro  HPI/Subjective: LUE slightly better, C/o HA, LLE unchanged  Objective: Filed Vitals:   05/10/14 0922  BP: 117/84  Pulse: 70  Temp: 98.3 F (36.8 C)  Resp: 20    Intake/Output Summary (Last 24 hours) at 05/10/14 1258 Last data filed at 05/10/14 1250  Gross per 24 hour  Intake    480 ml  Output      0 ml  Net    480 ml   There were no vitals filed for this visit.  Exam:   General:  AAOx3  Cardiovascular: S1S2/RRR  Respiratory: CTAB  Abdomen: soft, NT, BS present  Musculoskeletal: no edema c/c   Neuro: LLE 3/5, LUE 4/5  Data Reviewed: Basic Metabolic Panel:  Recent Labs Lab 05/09/14 1026  NA 136*  K 4.1  CL 101  CO2 22  GLUCOSE 83  BUN 10  CREATININE 1.28*  CALCIUM 8.9   Liver Function Tests: No results found for this basename: AST, ALT, ALKPHOS, BILITOT, PROT, ALBUMIN,  in the last 168 hours No results found for this basename: LIPASE, AMYLASE,  in the last 168 hours No results found for this basename: AMMONIA,  in the last 168 hours CBC:  Recent Labs Lab 05/09/14 1026  WBC 6.7  NEUTROABS 2.9  HGB 14.7  HCT 43.1  MCV 86.9  PLT 257   Cardiac Enzymes: No results found for this basename: CKTOTAL, CKMB, CKMBINDEX, TROPONINI,  in the last 168 hours BNP (last 3 results) No results found for this basename: PROBNP,  in the last  8760 hours CBG: No results found for this basename: GLUCAP,  in the last 168 hours  No results found for this or any previous visit (from the past 240 hour(s)).   Studies: Dg Chest 2 View  05/09/2014   CLINICAL DATA:  28 year old female with headache and dizziness. Abnormal right insula. Initial encounter.  EXAM: CHEST  2 VIEW  COMPARISON:  None.  FINDINGS: Mildly low lung volumes. Normal cardiac size and mediastinal contours. Visualized tracheal air column is within normal limits. Other than mild crowding of markings, the lungs are clear. No pneumothorax or effusion. Except for mild scoliosis negative visualized osseous structures.  IMPRESSION: Low lung volumes, otherwise negative.   Electronically Signed   By: Augusto GambleLee  Hall M.D.   On: 05/09/2014 15:25   Ct Head Wo Contrast  05/09/2014   CLINICAL DATA:  Headache and dizziness ; left-sided numbness  EXAM: CT HEAD WITHOUT CONTRAST  TECHNIQUE: Contiguous axial images were obtained from the base of the skull through the vertex without intravenous contrast.  COMPARISON:  None.  FINDINGS: The ventricles are normal in size and configuration. There is no appreciable mass, hemorrhage, extra-axial fluid collection, or midline shift.  There is decreased attenuation in the region of the insular cortex on the right at the level of the right basal ganglia. This finding is felt to represent an acute infarct in this region.  It is best seen on axial slices 11, 12, 13, and 14. Elsewhere gray-white compartments appear normal.  Bony calvarium appears intact. The mastoid air cells are clear. There is an air-fluid level in the superior right maxillary antrum.  IMPRESSION: Findings felt to represent acute infarct the region of the insular cortex on the right at the level of the basal ganglia. No hemorrhage or mass effect. Air-fluid level in right maxillary antrum.   Electronically Signed   By: Bretta Bang M.D.   On: 05/09/2014 11:12   Mr Brain Wo Contrast  05/09/2014    CLINICAL DATA:  Stroke. Dizziness and left hand and left leg weakness.  EXAM: MRI HEAD WITHOUT CONTRAST  MRA HEAD WITHOUT CONTRAST  TECHNIQUE: Multiplanar, multiecho pulse sequences of the brain and surrounding structures were obtained without intravenous contrast. Angiographic images of the head were obtained using MRA technique without contrast.  COMPARISON:  Head CT 05/09/2014  FINDINGS: MRI HEAD FINDINGS  Slight cerebellar tonsillar ectopia is noted, measuring 4 mm just left of midline. There is an acute infarct measuring approximately 3 x 1.5 x 2.5 cm involving the right posterior insula, subinsular white matter tracts, and a small portion of the putamen. Small foci of acute infarction are also present more superiorly and posteriorly in the right hemisphere involving posterior temporal lobe cortex, optic radiation, posterior aspects of the corona radiata and centrum semiovale, and subcortical parietal white matter. There is no evidence of intracranial hemorrhage. There is mild edema associated with these areas of infarct without significant mass effect. There is no mass, midline shift or extra-axial fluid collection. Ventricles and sulci are within normal limits. Incidental note is made of a cavum velum interpositum.  Orbits are unremarkable. There is moderate right maxillary sinus mucosal thickening with a small amount of sinus fluid. Focal left anterior ethmoid air cell opacification is noted. Mastoid air cells are clear.  MRA HEAD FINDINGS  Visualized distal vertebral arteries are patent with the right being mildly dominant. Left PICA origin is patent. Right PICA is not definitely identified. Right AICA is dominant. SCA origins are patent. Basilar artery is patent without stenosis. PCAs are unremarkable. There are small, patent posterior communicating arteries bilaterally.  Internal carotid arteries are patent from skullbase to carotid termini. Right right M1 segment is patent. Anterior/superior M2 division  appears patent. However, there is diminished flow in and occlusion of posterior right M2 branches. Left MCA and bilateral ACAs are unremarkable. Anterior communicating artery is patent. No intracranial aneurysm is identified.  IMPRESSION: 1. Acute right MCA territory infarct as above, with greatest involvement of the posterior insula. 2. Occlusion of posterior M2 division branches of the right MCA.   Electronically Signed   By: Sebastian Ache   On: 05/09/2014 20:31   Mr Maxine Glenn Head/brain Wo Cm  05/09/2014   CLINICAL DATA:  Stroke. Dizziness and left hand and left leg weakness.  EXAM: MRI HEAD WITHOUT CONTRAST  MRA HEAD WITHOUT CONTRAST  TECHNIQUE: Multiplanar, multiecho pulse sequences of the brain and surrounding structures were obtained without intravenous contrast. Angiographic images of the head were obtained using MRA technique without contrast.  COMPARISON:  Head CT 05/09/2014  FINDINGS: MRI HEAD FINDINGS  Slight cerebellar tonsillar ectopia is noted, measuring 4 mm just left of midline. There is an acute infarct measuring approximately 3 x 1.5 x 2.5 cm involving the right posterior insula, subinsular white matter tracts, and a small portion of the putamen. Small foci of acute infarction are also present more superiorly  and posteriorly in the right hemisphere involving posterior temporal lobe cortex, optic radiation, posterior aspects of the corona radiata and centrum semiovale, and subcortical parietal white matter. There is no evidence of intracranial hemorrhage. There is mild edema associated with these areas of infarct without significant mass effect. There is no mass, midline shift or extra-axial fluid collection. Ventricles and sulci are within normal limits. Incidental note is made of a cavum velum interpositum.  Orbits are unremarkable. There is moderate right maxillary sinus mucosal thickening with a small amount of sinus fluid. Focal left anterior ethmoid air cell opacification is noted. Mastoid air  cells are clear.  MRA HEAD FINDINGS  Visualized distal vertebral arteries are patent with the right being mildly dominant. Left PICA origin is patent. Right PICA is not definitely identified. Right AICA is dominant. SCA origins are patent. Basilar artery is patent without stenosis. PCAs are unremarkable. There are small, patent posterior communicating arteries bilaterally.  Internal carotid arteries are patent from skullbase to carotid termini. Right right M1 segment is patent. Anterior/superior M2 division appears patent. However, there is diminished flow in and occlusion of posterior right M2 branches. Left MCA and bilateral ACAs are unremarkable. Anterior communicating artery is patent. No intracranial aneurysm is identified.  IMPRESSION: 1. Acute right MCA territory infarct as above, with greatest involvement of the posterior insula. 2. Occlusion of posterior M2 division branches of the right MCA.   Electronically Signed   By: Sebastian AcheAllen  Grady   On: 05/09/2014 20:31    Scheduled Meds: .  stroke: mapping our early stages of recovery book   Does not apply Once  . aspirin  300 mg Rectal Daily   Or  . aspirin  325 mg Oral Daily  . enoxaparin (LOVENOX) injection  40 mg Subcutaneous Q24H   Continuous Infusions:  Antibiotics Given (last 72 hours)   None      Principal Problem:   CVA (cerebral infarction) Active Problems:   ARF (acute renal failure)    Time spent: 35min    [facility] Memorial HospitalJOSEPH,Jesse Nosbisch  [facility] Pager [phone]. If 7PM-7AM, please contact night-coverage at [email or web address], password [email or web address] 05/10/2014, 12:58 PM  LOS: 1 day

## 2014-05-10 NOTE — Progress Notes (Signed)
Pt for TEE 10 am tomorrow. Risks and benefits explained to pt and she is agreeable to proceed.   Corine ShelterLUKE Page Lancon PA-C 05/10/2014 3:41 PM

## 2014-05-10 NOTE — Progress Notes (Signed)
Stroke Team Progress Note  HISTORY Kristina [name] is an 28 y.o. female who noted on Monday 05/07/2014 to have a headache located in the bilateral frontal region. This was followed by a sensation something was crawling on her right arm. She later tells  that it was her left arm touching her right arm but she did not realize she was touching her arm. She did not seek medical attention at that time. On Tuesday she continued to have a HA associated with dizziness and weak left hand grip. Today 05/09/2014 she noted the symptoms again as well as difficulty walking as she would vere to the left. She decided to seek medical attention today. [facility] which showed acute infarct in the right insular cortex at the level of basal ganglia. [facility] She was last known well 05/07/2014, unable to determine time of onset. Patient was not administered TPA secondary to delay in arrival. She was admitted for further evaluation and treatment.  SUBJECTIVE Her twin sister and family member are at the bedside.  Overall she feels her condition is gradually improving. She complained of vertigo or ataxia and vision difficulties  Upon arrival but the symptoms have resolved and the MRI  findings cannot explain the above symptoms.  OBJECTIVE Most recent Vital Signs: Filed Vitals:   05/10/14 0200 05/10/14 0400 05/10/14 0555 05/10/14 0922  BP: 109/61 102/61 108/71 117/84  Pulse: 60 72 54 70  Temp: 98.6 F (37 C) 98.4 F (36.9 C) 97.9 F (36.6 C) 98.3 F (36.8 C)  TempSrc: Oral Oral Oral Oral  Resp: 18 16 18 20   SpO2: 100% 100% 99% 100%   CBG (last 3)  No results found for this basename: GLUCAP,  in the last 72 hours  IV Fluid Intake:     MEDICATIONS  .  stroke: mapping our early stages of recovery book   Does not apply Once  . aspirin  300 mg Rectal Daily   Or  . aspirin  325 mg Oral Daily  . enoxaparin (LOVENOX) injection  40 mg Subcutaneous Q24H   PRN:   acetaminophen, acetaminophen, HYDROcodone-acetaminophen, senna-docusate  Diet:  General thin liquids Activity:  OOB with assistance DVT Prophylaxis:  Lovenox 40 mg sq daily   CLINICALLY SIGNIFICANT STUDIES Basic Metabolic Panel:   Recent Labs Lab 05/09/14 1026  NA 136*  K 4.1  CL 101  CO2 22  GLUCOSE 83  BUN 10  CREATININE 1.28*  CALCIUM 8.9   Liver Function Tests: No results found for this basename: AST, ALT, ALKPHOS, BILITOT, PROT, ALBUMIN,  in the last 168 hours CBC:   Recent Labs Lab 05/09/14 1026  WBC 6.7  NEUTROABS 2.9  HGB 14.7  HCT 43.1  MCV 86.9  PLT 257   Coagulation: No results found for this basename: LABPROT, INR,  in the last 168 hours Cardiac Enzymes: No results found for this basename: CKTOTAL, CKMB, CKMBINDEX, TROPONINI,  in the last 168 hours Urinalysis: No results found for this basename: COLORURINE, APPERANCEUR, LABSPEC, PHURINE, GLUCOSEU, HGBUR, BILIRUBINUR, KETONESUR, PROTEINUR, UROBILINOGEN, NITRITE, LEUKOCYTESUR,  in the last 168 hours Lipid Panel    Component Value Date/Time   CHOL 132 05/10/2014 0430   TRIG 86 05/10/2014 0430   HDL 41 05/10/2014 0430   CHOLHDL 3.2 05/10/2014 0430   VLDL 17 05/10/2014 0430   LDLCALC 74 05/10/2014 0430   HgbA1C  No results found for this basename: HGBA1C    Urine Drug Screen:  No results found for this basename: labopia,  cocainscrnur,  labbenz,  amphetmu,  thcu,  labbarb    Alcohol Level: No results found for this basename: ETH,  in the last 168 hours   CT of the brain  05/09/2014    Findings felt to represent acute infarct the region of the insular cortex on the right at the level of the basal ganglia. No hemorrhage or mass effect. Air-fluid level in right maxillary antrum.   MRI of the brain  05/09/2014    Acute right MCA territory infarct as above, with greatest involvement of the posterior insula.  MRA of the brain  05/09/2014    Occlusion of posterior M2 division branches of the right MCA.     Carotid  Doppler    2D Echocardiogram    CXR  05/09/2014    Low lung volumes, otherwise negative.     EKG  Sinus arrhythmia. For complete results please see formal report.   Therapy Recommendations CIR  Physical Exam   Obese young african lady not in distress.Awake alert. Afebrile. Head is nontraumatic. Neck is supple without bruit. Hearing is normal. Cardiac exam no murmur or gallop. Lungs are clear to auscultation. Distal pulses are well felt. Neurological Exam : Awake alert oriented x3 with normal speech and language function. Fundi were not visualized. Vision acuity and fields are adequate. Extraocular movements revealed slightly saccadic dysmetria while looking to the left. No nystagmus. Face is symmetric without weakness. Tongue is midline. Motor system exam reveals no upper or lower eczema to drift. Diminished fine finger movements on the left. Orbits right over left upper extremity. Mild left finger-to-nose dysmetria. Symmetric strength and coordination in the lower eczema to his. Mild subjective left hemisensory loss with some splitting of the midline. And including forehead  ASSESSMENT [name] is a 28 y.o. female presenting with HA, left hand weakness, dizziness, difficulty walking. Imaging confirms a right MCA infarct. Patient presentation does not correspond to identifiedand infarct. Likey with brainstem lesion not seen on MRI. Infarct felt to be embolic secondary to unknown source.  On no antithrombotic prior to admission. Now on aspirin 325 mg orally every day for secondary stroke prevention. Patient with resultant left hemiparesis, ataxia. Stroke work up underway.. she'll stroke in young workup with evaluation for vasculitis, hypercoagulable disorder and  TEE   etoh use  IUD (Mirena, levonorgestrel-releasing intrauterine system) in place  History of migraines  Hospital day # 1  TREATMENT/PLAN  Continue aspirin 325 mg orally every day for secondary stroke  prevention.  F/u 2D, carotid doppler, HgbA1c  Rehab consult TEE to look for embolic source. Given holiday tomorrow, have asked [facility] to add to their list for Monday 05/14/14. If patient remained hospitalized, will do TEE Monday, otherwise will have done as an outpatient in the future. If TEE positive for PFO (patent foramen ovale), check bilateral lower extremity venous dopplers to rule out DVT as possible source of stroke.  F/u hypercoagulable tests (unremarkable so far) TCD w/ bubble in am LE venous dopplers Recommend IUD removal  SIGNED Annie MainSHARON BIBY, MSN, RN, ANVP-BC, ANP-BC, GNP-BC [facility] Pager: [phone] 05/10/2014 12:30 PM  I have personally obtained a history, examined the patient, reviewed lab and  imaging studies, and formulated the assessment and plan of care. I agree with the above. Delia HeadyPramod Vernel Langenderfer, MD   To contact Stroke Continuity provider, please refer to [email or web address]. After hours, contact General Neurology

## 2014-05-10 NOTE — Progress Notes (Signed)
VASCULAR LAB PRELIMINARY  PRELIMINARY  PRELIMINARY  PRELIMINARY  Carotid duplex completed.    Preliminary report:  Bilateral:  1-39% ICA stenosis.  Vertebral artery flow is antegrade.      Kess Mcilwain, RVT 05/10/2014, 1:20 PM

## 2014-05-10 NOTE — Progress Notes (Addendum)
Talked to patient about discharge planning; patient lost her job in April 2015 and does not have insurance or PCP; Patient is agreeable to go to Dr Ashley RoyaltyMatthews ( [facility] and Dr Ashley RoyaltyMatthews has agreed to see pt with no PCP); apt made for July 28,2015 at 3:45 pm; eligibility apt for orange card to [facility] - June 07, 2014 at 11:30 am; patient lives with her mother, pharmacy of choice is [facility]; [facility] and she went back to have it rechecked 2014; Abelino DerrickB Tierria Watson RN,BSN,MHA

## 2014-05-10 NOTE — Consult Note (Signed)
Physical Medicine and Rehabilitation Consult  Reason for Consult: Left sided weakness and dizziness Referring Physician: Dr. Jomarie Longs   HPI: Kristina [name] is a 28 y.o. female in relatively good health who was admitted on 05/09/14 with headaches x 3 days with numbness RUE progressing to weakness and dizziness as well as difficulty walking. MRI/MRA brain done revealing acute R-MCA infarct with greatest involvement of posterior insula and occlusion of posterior M2 division branches of R-MCA. Hypercoagulopathy panel ordered and work up ongoing. Neurology questions hormonal therapy as contributing factor as she has levonorgestrel IUD since 2012.  PT evaluation done today and patient with left neglect with LLE instability affecting mobility. CIR recommended for follow up therapy.    Review of Systems  HENT: Negative for hearing loss.   Eyes: Negative for blurred vision and double vision.  Respiratory: Negative for cough and shortness of breath.   Cardiovascular: Negative for chest pain and palpitations.  Gastrointestinal: Negative for heartburn and nausea.  Musculoskeletal: Negative for back pain and myalgias.  Neurological: Positive for sensory change and focal weakness. Negative for headaches.    History reviewed. No pertinent past medical history.  History reviewed. No pertinent past surgical history.  Family History  Problem Relation Age of Onset  . High blood pressure Mother   . Glaucoma Mother    Social History:  Single. Lives with family.  She reports that she has never smoked. She has never used smokeless tobacco. She reports that she drinks alcohol occasionally. She reports that she does not use illicit drugs.  Allergies: No Known Allergies  Medications Prior to Admission  Medication Sig Dispense Refill  . ibuprofen (ADVIL,MOTRIN) 200 MG tablet Take 600 mg by mouth every 6 (six) hours as needed (pain). pain      . ibuprofen (ADVIL,MOTRIN) 200 MG tablet Take 800 mg by  mouth every 6 (six) hours as needed for moderate pain.      Marland Kitchen levonorgestrel (MIRENA) 20 MCG/24HR IUD 1 each by Intrauterine route once.        Home: Home Living Family/patient expects to be discharged to:: Private residence Living Arrangements: Other relatives;Parent Available Help at Discharge: Family;Available PRN/intermittently (maybe friends for 24/7 (A) ) Type of Home: Apartment Home Access: Level entry Home Layout: One level Home Equipment: None Additional Comments: last job recently; lives with sister and mother but both work   Functional History: Prior Function Level of Independence: Independent Functional Status:  Mobility: Bed Mobility Overal bed mobility: Needs Assistance Bed Mobility: Supine to Sit;Sit to Supine Supine to sit: Min guard;HOB elevated Sit to supine: Supervision General bed mobility comments: min guard to prevent hips from sliding off EOB; HOB elevated and pt relied on handrails for sit <> supine; demo Lt sided neglect and required cues to bring Lt UE with her transitioning to EOB Transfers Overall transfer level: Needs assistance Equipment used: 2 person hand held assist Transfers: Sit to/from Stand Sit to Stand: Mod assist General transfer comment: pt unsteady with transfers and demo Lt LE buckling initially 2 person HHA for safety; cues for safety pt bracing LEs on EOB Ambulation/Gait Ambulation/Gait assistance: Mod assist Ambulation Distance (Feet): 80 Feet Assistive device: 1 person hand held assist;2 person hand held assist Gait Pattern/deviations: Step-through pattern;Decreased dorsiflexion - left;Decreased stance time - left;Decreased step length - right;Steppage;Narrow base of support;Trunk flexed;Shuffle Gait velocity: impulsively fast; cues to slow down for safety General Gait Details: pt with difficulty clearing Lt LE during swing phase; could correct with max  cues and facilitation through pelvis; pt compensation technique was a steppage gt  on Lt side; pt inconsistant with gt and would benefit from 2 person (A) if performing higher level balance activities     ADL:    Cognition: Cognition Overall Cognitive Status: Impaired/Different from baseline Orientation Level: Oriented X4 Cognition Arousal/Alertness: Awake/alert Behavior During Therapy: Impulsive;Flat affect Overall Cognitive Status: Impaired/Different from baseline Area of Impairment: Safety/judgement;Problem solving;Attention Current Attention Level: Selective Safety/Judgement: Decreased awareness of safety;Decreased awareness of deficits Problem Solving: Difficulty sequencing;Requires verbal cues;Requires tactile cues General Comments: uncertain if attention and safety awareness deficits are due to cognition deficits vs frustration due to situation; pt with flat affect and becomes agitated with incr questioning; wants to d/c home  Blood pressure 117/84, pulse 70, temperature 98.3 F (36.8 C), temperature source Oral, resp. rate 20, last menstrual period 05/02/2014, SpO2 100.00%. Physical Exam  Nursing note and vitals reviewed. Constitutional: She is oriented to person, place, and time. She appears well-developed and well-nourished.  Flat affect. Did not make eye contact.  HENT:  Head: Normocephalic and atraumatic.  Neck: Normal range of motion. Neck supple.  Cardiovascular: Normal rate and regular rhythm.   Respiratory: Effort normal and breath sounds normal. No respiratory distress. She has no wheezes.  GI: Soft. Bowel sounds are normal.  Musculoskeletal: She exhibits no edema and no tenderness.  Neurological: She is alert and oriented to person, place, and time.  Speech clear but short answers to questions asked.  Able to follow basic commands without difficulty. Sensory deficits LLE>LUE. Pronator drift LUE.   Skin: Skin is warm and dry.  Psychiatric: Her speech is normal. Her affect is blunt. She is withdrawn.   Manual muscle testing: 5/5 right deltoid,  bicep, tricep, grip, hip flexion, knee extensors, ankle dorsiflexor and plantar flexor 4+/5 left deltoid, bicep, tricep, grip 3 minus left hip flexors, knee extensors 2 minus ankle dorsiflexor plantar flexor Visual fields are intact the confrontational testing there is no evidence of gaze preference Results for orders placed during the hospital encounter of 05/09/14 (from the past 24 hour(s))  ANTITHROMBIN III     Status: None   Collection Time    05/09/14  3:12 PM      Result Value Ref Range   AntiThromb III Func 97  75 - 120 %  HOMOCYSTEINE     Status: None   Collection Time    05/09/14  3:12 PM      Result Value Ref Range   Homocysteine 8.8  4.0 - 15.4 umol/L  ANA     Status: None   Collection Time    05/09/14  3:12 PM      Result Value Ref Range   ANA NEGATIVE  NEGATIVE  LIPID PANEL     Status: None   Collection Time    05/10/14  4:30 AM      Result Value Ref Range   Cholesterol 132  0 - 200 mg/dL   Triglycerides 86  <161 mg/dL   HDL 41  >09 mg/dL   Total CHOL/HDL Ratio 3.2     VLDL 17  0 - 40 mg/dL   LDL Cholesterol 74  0 - 99 mg/dL   Dg Chest 2 View  6/0/4540   CLINICAL DATA:  28 year old female with headache and dizziness. Abnormal right insula. Initial encounter.  EXAM: CHEST  2 VIEW  COMPARISON:  None.  FINDINGS: Mildly low lung volumes. Normal cardiac size and mediastinal contours. Visualized tracheal air column is within normal  limits. Other than mild crowding of markings, the lungs are clear. No pneumothorax or effusion. Except for mild scoliosis negative visualized osseous structures.  IMPRESSION: Low lung volumes, otherwise negative.   Electronically Signed   By: Augusto Gamble M.D.   On: 05/09/2014 15:25   Ct Head Wo Contrast  05/09/2014   CLINICAL DATA:  Headache and dizziness ; left-sided numbness  EXAM: CT HEAD WITHOUT CONTRAST  TECHNIQUE: Contiguous axial images were obtained from the base of the skull through the vertex without intravenous contrast.  COMPARISON:   None.  FINDINGS: The ventricles are normal in size and configuration. There is no appreciable mass, hemorrhage, extra-axial fluid collection, or midline shift.  There is decreased attenuation in the region of the insular cortex on the right at the level of the right basal ganglia. This finding is felt to represent an acute infarct in this region. It is best seen on axial slices 11, 12, 13, and 14. Elsewhere gray-white compartments appear normal.  Bony calvarium appears intact. The mastoid air cells are clear. There is an air-fluid level in the superior right maxillary antrum.  IMPRESSION: Findings felt to represent acute infarct the region of the insular cortex on the right at the level of the basal ganglia. No hemorrhage or mass effect. Air-fluid level in right maxillary antrum.   Electronically Signed   By: Bretta Bang M.D.   On: 05/09/2014 11:12   Mr Brain Wo Contrast  05/09/2014   CLINICAL DATA:  Stroke. Dizziness and left hand and left leg weakness.  EXAM: MRI HEAD WITHOUT CONTRAST  MRA HEAD WITHOUT CONTRAST  TECHNIQUE: Multiplanar, multiecho pulse sequences of the brain and surrounding structures were obtained without intravenous contrast. Angiographic images of the head were obtained using MRA technique without contrast.  COMPARISON:  Head CT 05/09/2014  FINDINGS: MRI HEAD FINDINGS  Slight cerebellar tonsillar ectopia is noted, measuring 4 mm just left of midline. There is an acute infarct measuring approximately 3 x 1.5 x 2.5 cm involving the right posterior insula, subinsular white matter tracts, and a small portion of the putamen. Small foci of acute infarction are also present more superiorly and posteriorly in the right hemisphere involving posterior temporal lobe cortex, optic radiation, posterior aspects of the corona radiata and centrum semiovale, and subcortical parietal white matter. There is no evidence of intracranial hemorrhage. There is mild edema associated with these areas of infarct  without significant mass effect. There is no mass, midline shift or extra-axial fluid collection. Ventricles and sulci are within normal limits. Incidental note is made of a cavum velum interpositum.  Orbits are unremarkable. There is moderate right maxillary sinus mucosal thickening with a small amount of sinus fluid. Focal left anterior ethmoid air cell opacification is noted. Mastoid air cells are clear.  MRA HEAD FINDINGS  Visualized distal vertebral arteries are patent with the right being mildly dominant. Left PICA origin is patent. Right PICA is not definitely identified. Right AICA is dominant. SCA origins are patent. Basilar artery is patent without stenosis. PCAs are unremarkable. There are small, patent posterior communicating arteries bilaterally.  Internal carotid arteries are patent from skullbase to carotid termini. Right right M1 segment is patent. Anterior/superior M2 division appears patent. However, there is diminished flow in and occlusion of posterior right M2 branches. Left MCA and bilateral ACAs are unremarkable. Anterior communicating artery is patent. No intracranial aneurysm is identified.  IMPRESSION: 1. Acute right MCA territory infarct as above, with greatest involvement of the posterior insula. 2. Occlusion  of posterior M2 division branches of the right MCA.   Electronically Signed   By: Sebastian AcheAllen  Grady   On: 05/09/2014 20:31   Mr Maxine GlennMra Head/brain Wo Cm  05/09/2014   CLINICAL DATA:  Stroke. Dizziness and left hand and left leg weakness.  EXAM: MRI HEAD WITHOUT CONTRAST  MRA HEAD WITHOUT CONTRAST  TECHNIQUE: Multiplanar, multiecho pulse sequences of the brain and surrounding structures were obtained without intravenous contrast. Angiographic images of the head were obtained using MRA technique without contrast.  COMPARISON:  Head CT 05/09/2014  FINDINGS: MRI HEAD FINDINGS  Slight cerebellar tonsillar ectopia is noted, measuring 4 mm just left of midline. There is an acute infarct measuring  approximately 3 x 1.5 x 2.5 cm involving the right posterior insula, subinsular white matter tracts, and a small portion of the putamen. Small foci of acute infarction are also present more superiorly and posteriorly in the right hemisphere involving posterior temporal lobe cortex, optic radiation, posterior aspects of the corona radiata and centrum semiovale, and subcortical parietal white matter. There is no evidence of intracranial hemorrhage. There is mild edema associated with these areas of infarct without significant mass effect. There is no mass, midline shift or extra-axial fluid collection. Ventricles and sulci are within normal limits. Incidental note is made of a cavum velum interpositum.  Orbits are unremarkable. There is moderate right maxillary sinus mucosal thickening with a small amount of sinus fluid. Focal left anterior ethmoid air cell opacification is noted. Mastoid air cells are clear.  MRA HEAD FINDINGS  Visualized distal vertebral arteries are patent with the right being mildly dominant. Left PICA origin is patent. Right PICA is not definitely identified. Right AICA is dominant. SCA origins are patent. Basilar artery is patent without stenosis. PCAs are unremarkable. There are small, patent posterior communicating arteries bilaterally.  Internal carotid arteries are patent from skullbase to carotid termini. Right right M1 segment is patent. Anterior/superior M2 division appears patent. However, there is diminished flow in and occlusion of posterior right M2 branches. Left MCA and bilateral ACAs are unremarkable. Anterior communicating artery is patent. No intracranial aneurysm is identified.  IMPRESSION: 1. Acute right MCA territory infarct as above, with greatest involvement of the posterior insula. 2. Occlusion of posterior M2 division branches of the right MCA.   Electronically Signed   By: Sebastian AcheAllen  Grady   On: 05/09/2014 20:31    Assessment/Plan: Diagnosis: Right MCA distribution  infarct with left lower extremity greater than left upper extremity weakness 1. Does the need for close, 24 hr/day medical supervision in concert with the patient's rehab needs make it unreasonable for this patient to be served in a less intensive setting? Potentially 2. Co-Morbidities requiring supervision/potential complications: Acute renal failure, mild 3. Due to bowel management, safety, skin/wound care, disease management, medication administration, pain management and patient education, does the patient require 24 hr/day rehab nursing? Potentially 4. Does the patient require coordinated care of a physician, rehab nurse, PT (1-2 hrs/day, 5 days/week) and OT (1-2 hrs/day, 5 days/week) to address physical and functional deficits in the context of the above medical diagnosis(es)? Potentially Addressing deficits in the following areas: balance, endurance, locomotion, strength, transferring, bowel/bladder control, bathing, dressing, feeding, grooming and toileting 5. Can the patient actively participate in an intensive therapy program of at least 3 hrs of therapy per day at least 5 days per week? Yes 6. The potential for patient to make measurable gains while on inpatient rehab is excellent 7. Anticipated functional outcomes upon discharge from  inpatient rehab are modified independent  with PT, modified independent with OT, n/a with SLP. 8. Estimated rehab length of stay to reach the above functional goals is: 7-10 days 9. Does the patient have adequate social supports to accommodate these discharge functional goals? Yes 10. Anticipated D/C setting: Home 11. Anticipated post D/C treatments: HH therapy 12. Overall Rehab/Functional Prognosis: excellent  RECOMMENDATIONS: This patient's condition is appropriate for continued rehabilitative care in the following setting: CIR unless patient progresses to supervision level in the next 1-2 days Patient has agreed to participate in recommended program.  Potentially Note that insurance prior authorization may be required for reimbursement for recommended care.  Comment:     05/10/2014

## 2014-05-10 NOTE — Evaluation (Addendum)
Physical Therapy Evaluation Patient Details Name: [place]ina [place] [name] MRN: [id number] DOB: June 05, 1986 Today's Date: 05/10/2014   History of Present Illness  28 y.o. female with complaints of dizziness and left hand and leg weakness.  CT head reviewed and shows an area of hypodensity in the right insular cortex.  Acute infarct is suspected.  Further work up recommended. MRI revealed acute Rt MCA infarct.   Clinical Impression  Pt adm from home due to the above. Presents with significant decline in independence with functional mobility secondary to deficits indicated below. Pt to benefit from skilled acute PT to address deficits and maximize functional mobility prior to D/C to next appropriate venue.Pt demo Lt sided neglect during session and Lt sided LE weakness attributing to gt abnormalities and increased risk of falls. PTA pt was fully independent with all ADLs and is very young and motivated to return to independence.  Will recommend CIR to maximize functional independence prior to returnign home.     Follow Up Recommendations CIR    Equipment Recommendations  Other (comment) (TBD)    Recommendations for Other Services Rehab consult;OT consult     Precautions / Restrictions Precautions Precautions: Fall Restrictions Weight Bearing Restrictions: No      Mobility  Bed Mobility Overal bed mobility: Needs Assistance Bed Mobility: Supine to Sit;Sit to Supine     Supine to sit: Min guard;HOB elevated Sit to supine: Supervision   General bed mobility comments: min guard to prevent hips from sliding off EOB; HOB elevated and pt relied on handrails for sit <> supine; demo Lt sided neglect and required cues to bring Lt UE with her transitioning to EOB  Transfers Overall transfer level: Needs assistance Equipment used: 2 person hand held assist Transfers: Sit to/from Stand Sit to Stand: Mod assist         General transfer comment: pt unsteady with transfers and demo Lt LE buckling  initially 2 person HHA for safety; cues for safety pt bracing LEs on EOB  Ambulation/Gait Ambulation/Gait assistance: Mod assist Ambulation Distance (Feet): 80 Feet Assistive device: 1 person hand held assist;2 person hand held assist Gait Pattern/deviations: Step-through pattern;Decreased dorsiflexion - left;Decreased stance time - left;Decreased step length - right;Steppage;Narrow base of support;Trunk flexed;Shuffle Gait velocity: impulsively fast; cues to slow down for safety   General Gait Details: pt with difficulty clearing Lt LE during swing phase; could correct with max cues and facilitation through pelvis; pt compensation technique was a steppage gt on Lt side; pt inconsistant with gt and would benefit from 2 person (A) if performing higher level balance activities   Stairs            Wheelchair Mobility    Modified Rankin (Stroke Patients Only) Modified Rankin (Stroke Patients Only) Pre-Morbid Rankin Score: No symptoms Modified Rankin: Moderately severe disability     Balance Overall balance assessment: Needs assistance Sitting-balance support: Feet supported;No upper extremity supported Sitting balance-Leahy Scale: Fair     Standing balance support: During functional activity;Single extremity supported Standing balance-Leahy Scale: Poor Standing balance comment: difficulty shifting weight onto Lt LE without buckling due to decreased strength; requires UE support for balance                              Pertinent Vitals/Pain No c/o pain     Home Living Family/patient expects to be discharged to:: Private residence Living Arrangements: Other relatives;Parent Available Help at Discharge: Family;Available PRN/intermittently (maybe friends for 24/7 (  A) ) Type of Home: Apartment Home Access: Level entry     Home Layout: One level Home Equipment: None Additional Comments: last job recently; lives with sister and mother but both work     Prior  Function Level of Independence: Independent               Higher education careers adviserHand Dominance        Extremity/Trunk Assessment   Upper Extremity Assessment: Defer to OT evaluation           Lower Extremity Assessment: LLE deficits/detail   LLE Deficits / Details: DF 2/5; knee 3-/5 hip 3-/5  Cervical / Trunk Assessment: Normal  Communication   Communication: No difficulties  Cognition Arousal/Alertness: Awake/alert Behavior During Therapy: Impulsive;Flat affect Overall Cognitive Status: Impaired/Different from baseline Area of Impairment: Safety/judgement;Problem solving;Attention   Current Attention Level: Selective     Safety/Judgement: Decreased awareness of safety;Decreased awareness of deficits   Problem Solving: Difficulty sequencing;Requires verbal cues;Requires tactile cues General Comments: uncertain if attention and safety awareness deficits are due to cognition deficits vs frustration due to situation; pt with flat affect and becomes agitated with incr questioning; wants to d/c home    General Comments General comments (skin integrity, edema, etc.): Pt demo Lt sided neglect with motor planning; was inconsistant at times during session; difficulty to determine due to pt becoming agitated      Exercises        Assessment/Plan    PT Assessment Patient needs continued PT services  PT Diagnosis Abnormality of gait;Generalized weakness   PT Problem List Decreased strength;Decreased activity tolerance;Decreased balance;Decreased mobility;Decreased cognition;Decreased knowledge of use of DME;Decreased safety awareness;Impaired sensation  PT Treatment Interventions DME instruction;Gait training;Functional mobility training;Therapeutic activities;Therapeutic exercise;Balance training;Neuromuscular re-education;Cognitive remediation;Patient/family education   PT Goals (Current goals can be found in the Care Plan section) Acute Rehab PT Goals Patient Stated Goal: to go home PT  Goal Formulation: With patient Time For Goal Achievement: 05/17/14 Potential to Achieve Goals: Good    Frequency Min 4X/week   Barriers to discharge Decreased caregiver support family works during the day     Co-evaluation               End of Session Equipment Utilized During Treatment: Gait belt Activity Tolerance: Patient limited by fatigue Patient left: in bed;with call bell/phone within reach;with chair alarm set;with family/visitor present Nurse Communication: Mobility status;Precautions         Time: [id number] PT Time Calculation (min): 18 min   Charges:   PT Evaluation $Initial PT Evaluation Tier I: 1 Procedure PT Treatments $Gait Training: 8-22 mins   PT G CodesDonell Sievert:          Mariza Bourget N, [place]PT  [phone] 05/10/2014, 11:47 AM

## 2014-05-10 NOTE — Progress Notes (Signed)
Rehab Admissions Coordinator Note:  Patient was screened by Clois DupesBoyette, Lataya Varnell Godwin for appropriateness for an Inpatient Acute Rehab Consult.  At this time, we are recommending Inpatient Rehab consult. I will contact Dr. Jomarie LongsJoseph.  Clois DupesBoyette, Donika Butner Godwin 05/10/2014, 12:09 PM  I can be reached at [phone].

## 2014-05-10 NOTE — Evaluation (Signed)
Occupational Therapy Evaluation Patient Details Name: Kristina [name] MRN: [id number] DOB: 09-Jul-1986 Today's Date: 05/10/2014    History of Present Illness 28 y.o. female with complaints of dizziness and left hand and leg weakness.  CT head reviewed and shows an area of hypodensity in the right insular cortex.  Acute infarct is suspected.  Further work up recommended. MRI revealed acute Rt MCA infarct.    Clinical Impression   This 28 yo female admitted and found to have above presents to acute OT with decreased safety awareness, decreased coordination of LUE, decreased saccades, left inattention, decreased balance all affecting pt's ability to care for herself. She will benefit from acute OT with follow up on CIR to get to an intermittent S/Mod I level.    Follow Up Recommendations  CIR    Equipment Recommendations   (TBD)       Precautions / Restrictions Precautions Precautions: Fall Restrictions Weight Bearing Restrictions: No      Mobility Bed Mobility               General bed mobility comments: Pt sitting in chair upon arrival  Transfers Overall transfer level: Needs assistance Equipment used: Rolling walker (2 wheeled) Transfers: Sit to/from Stand Sit to Stand: Min assist         General transfer comment: No knee buckling this PM    Balance Overall balance assessment: Needs assistance Sitting-balance support: Feet supported;No upper extremity supported Sitting balance-Leahy Scale: Fair     Standing balance support: Bilateral upper extremity supported Standing balance-Leahy Scale: Poor                              ADL Overall ADL's : Modified independent;Needs assistance/impaired Eating/Feeding: Independent;Sitting   Grooming: Set up;Supervision/safety;Sitting   Upper Body Bathing: Set up;Supervision/ safety;Sitting   Lower Body Bathing: Minimal assistance;Sit to/from stand   Upper Body Dressing : Minimal assistance;Sitting    Lower Body Dressing: Minimal assistance;Sit to/from stand   Toilet Transfer: Minimal assistance;Regular Toilet;Grab bars   Toileting- Clothing Manipulation and Hygiene: Minimal assistance;Sit to/from stand               Vision Eye Alignment: Within Functional Limits Alignment/Gaze Preference: Within Defined Limits Ocular Range of Motion: Within Functional Limits Tracking/Visual Pursuits: Able to track stimulus in all quads without difficulty Saccades: Additional eye shifts occurred during testing Convergence: Within functional limits     Additional Comments: Did run into door frame on left with RW as she entered the bathroom          Pertinent Vitals/Pain No c/o pain     Hand Dominance Right   Extremity/Trunk Assessment Upper Extremity Assessment Upper Extremity Assessment: RUE deficits/detail;LUE deficits/detail RUE Deficits / Details: none LUE Deficits / Details: moves it more slowly compared to the rigth LUE Sensation: decreased proprioception LUE Coordination: decreased fine motor;decreased gross motor           Communication Communication Communication: No difficulties   Cognition Arousal/Alertness: Awake/alert Behavior During Therapy: Flat affect Overall Cognitive Status: Impaired/Different from baseline Area of Impairment: Safety/judgement   Current Attention Level: Selective     Safety/Judgement: Decreased awareness of deficits     General Comments: Pt needed VCs to lift her LLE up more off of the floor due to pt dragging it. Also she ran into the door frame into the bathroom on her left side with the RW as she entered bathroom  Home Living Family/patient expects to be discharged to:: Private residence Living Arrangements: Other relatives;Parent Available Help at Discharge: Family;Available PRN/intermittently Type of Home: Apartment Home Access: Level entry     Home Layout: One level     Bathroom Shower/Tub: Tub/shower  unit Shower/tub characteristics: Engineer, building servicesCurtain Bathroom Toilet: Standard     Home Equipment: None   Additional Comments: lost job recently; lives with sister and mother but both work       Prior Functioning/Environment Level of Independence: Independent             OT Diagnosis: Generalized weakness;Cognitive deficits;Hemiplegia non-dominant side;Disturbance of vision   OT Problem List: Decreased strength;Decreased range of motion;Impaired balance (sitting and/or standing);Decreased safety awareness;Decreased coordination;Decreased knowledge of use of DME or AE;Impaired UE functional use   OT Treatment/Interventions: Self-care/ADL training;Patient/family education;Balance training;Visual/perceptual remediation/compensation;Cognitive remediation/compensation;DME and/or AE instruction    OT Goals(Current goals can be found in the care plan section) Acute Rehab OT Goals Patient Stated Goal: to go home OT Goal Formulation: With patient Time For Goal Achievement: 05/17/14 Potential to Achieve Goals: Good  OT Frequency: Min 3X/week   Barriers to D/C: Decreased caregiver support             End of Session Equipment Utilized During Treatment: Gait belt;Rolling walker  Activity Tolerance: Patient tolerated treatment well Patient left: in chair;with call bell/phone within reach;with family/visitor present   Time: 1349-1406 OT Time Calculation (min): 17 min Charges:  OT General Charges $OT Visit: 1 Procedure OT Evaluation $Initial OT Evaluation Tier I: 1 Procedure OT Treatments $Self Care/Home Management : 8-22 mins  Evette GeorgesLeonard, Graciano Batson Eva  [phone] 05/10/2014, 4:47 PM

## 2014-05-10 NOTE — Progress Notes (Signed)
Echocardiogram 2D Echocardiogram has been performed.  Eviana Sibilia 05/10/2014, 1:53 PM

## 2014-05-11 DIAGNOSIS — I6340 Cerebral infarction due to embolism of unspecified cerebral artery: Secondary | ICD-10-CM

## 2014-05-11 DIAGNOSIS — E669 Obesity, unspecified: Secondary | ICD-10-CM | POA: Diagnosis present

## 2014-05-11 DIAGNOSIS — Z975 Presence of (intrauterine) contraceptive device: Secondary | ICD-10-CM

## 2014-05-11 DIAGNOSIS — N179 Acute kidney failure, unspecified: Secondary | ICD-10-CM

## 2014-05-11 NOTE — Progress Notes (Signed)
I met with pt at bedside. We are recommending outpt therapy and 24/7 supervision at d/c. Inpt rehab admission not recommended at this time. I discussed with Dr. Aretta Nip. [phone]

## 2014-05-11 NOTE — Progress Notes (Signed)
Stroke Team Progress Note  HISTORY Kristina [name] is an 28 y.o. female who noted on Monday 05/07/2014 to have a headache located in the bilateral frontal region. This was followed by a sensation something was crawling on her right arm. She later tells  that it was her left arm touching her right arm but she did not realize she was touching her arm. She did not seek medical attention at that time. On Tuesday she continued to have a HA associated with dizziness and weak left hand grip. Today 05/09/2014 she noted the symptoms again as well as difficulty walking as she would vere to the left. She decided to seek medical attention today. Patient obtained a CT head at [facility] ED which showed acute infarct in the right insular cortex at the level of basal ganglia. Patient was transferred to [facility] hospital. She was last known well 05/07/2014, unable to determine time of onset. Patient was not administered TPA secondary to delay in arrival. She was admitted for further evaluation and treatment.  SUBJECTIVE No family at bedside. Patient playing on her phone. Complains of a headache, no resolved with tylenol.  OBJECTIVE Most recent Vital Signs: Filed Vitals:   05/10/14 2210 05/11/14 0144 05/11/14 0541 05/11/14 1000  BP: 142/102 99/66 109/55 118/70  Pulse: 57 71 83 76  Temp: 97.9 F (36.6 C) 98 F (36.7 C) 98 F (36.7 C) 98.1 F (36.7 C)  TempSrc: Oral Oral Oral Oral  Resp: _0 SpO2: 100% 99% 98% 100%   CBG (last 3)  No results found for this basename: GLUCAP,  in the last 72 hours  IV Fluid Intake:     MEDICATIONS  .  stroke: mapping our early stages of recovery book   Does not apply Once  . aspirin  300 mg Rectal Daily   Or  . aspirin  325 mg Oral Daily  . enoxaparin (LOVENOX) injection  40 mg Subcutaneous Q24H   PRN:  acetaminophen, acetaminophen, HYDROcodone-acetaminophen, senna-docusate  Diet:  General thin liquids Activity:  OOB with assistance DVT Prophylaxis:  Lovenox 40 mg sq  daily   CLINICALLY SIGNIFICANT STUDIES Basic Metabolic Panel:   Recent Labs Lab 05/09/14 1026  NA 136*  K 4.1  CL 101  CO2 22  GLUCOSE 83  BUN 10  CREATININE 1.28*  CALCIUM 8.9   Liver Function Tests: No results found for this basename: AST, ALT, ALKPHOS, BILITOT, PROT, ALBUMIN,  in the last 168 hours CBC:   Recent Labs Lab 05/09/14 1026  WBC 6.7  NEUTROABS 2.9  HGB 14.7  HCT 43.1  MCV 86.9  PLT 257   Coagulation: No results found for this basename: LABPROT, INR,  in the last 168 hours Cardiac Enzymes: No results found for this basename: CKTOTAL, CKMB, CKMBINDEX, TROPONINI,  in the last 168 hours Urinalysis: No results found for this basename: COLORURINE, APPERANCEUR, LABSPEC, PHURINE, GLUCOSEU, HGBUR, BILIRUBINUR, KETONESUR, PROTEINUR, UROBILINOGEN, NITRITE, LEUKOCYTESUR,  in the last 168 hours Lipid Panel    Component Value Date/Time   CHOL 132 05/10/2014 0430   TRIG 86 05/10/2014 0430   HDL 41 05/10/2014 0430   CHOLHDL 3.2 05/10/2014 0430   VLDL 17 05/10/2014 0430   LDLCALC 74 05/10/2014 0430   HgbA1C  Lab Results  Component Value Date   HGBA1C 5.6 05/10/2014    Urine Drug Screen:   No results found for this basename: labopia,  cocainscrnur,  labbenz,  amphetmu,  thcu,  labbarb    Alcohol Level: No  results found for this basename: ETH,  in the last 168 hours   CT of the brain  05/09/2014    Findings felt to represent acute infarct the region of the insular cortex on the right at the level of the basal ganglia. No hemorrhage or mass effect. Air-fluid level in right maxillary antrum.   MRI of the brain  05/09/2014    Acute right MCA territory infarct as above, with greatest involvement of the posterior insula.  MRA of the brain  05/09/2014    Occlusion of posterior M2 division branches of the right MCA.     Carotid Doppler  No evidence of hemodynamically significant internal carotid artery stenosis. Vertebral artery flow is antegrade.   TCD w/ bubble   LE Venous Dopplers     2D Echocardiogram  EF 55-60% with no source of embolus.   TEE   CXR  05/09/2014    Low lung volumes, otherwise negative.     EKG  Sinus arrhythmia. For complete results please see formal report.   Therapy Recommendations CIR  Physical Exam   Obese young african lady in acute distress.Awake alert. Afebrile. Head is nontraumatic. Neck is supple. Hearing is grossly normal. Cardiac exam with HRR. no murmur or gallop. Lungs are clear to auscultation. Distal pulses are 2+. Neurological Exam : Awake alert oriented x3 with normal speech and language function. Fundi were not visualized. Vision acuity and fields are adequate. Extraocular movements intact. No nystagmus. Face is symmetric without weakness. Tongue is midline. Motor system exam reveals left UE drift. Diminished fine finger movements on the left. Orbits right over left upper extremity. Mild left finger-to-nose dysmetria. Symmetric strength and coordination in the lower extremities. Mild subjective left hemisensory loss greater in LLE than LUE; symmetric in face.   ASSESSMENT [name] is a 28 y.o. female presenting with HA, left hand weakness, dizziness, difficulty walking. Imaging confirms a right MCA infarct. Patient presentation does not correspond to identified infarct. Likey with brainstem lesion not seen on MRI. Infarct felt to be embolic secondary to unknown source.  On no antithrombotic prior to admission. Now on aspirin 325 mg orally every day for secondary stroke prevention. Patient with resultant left hemiparesis, ataxia, headache. Stroke work up in the young underway. Hypercoagulable panel thus far unrevealing, Protein C activity elevated (not related).   etoh use  IUD (Mirena, levonorgestrel-releasing intrauterine system) in place  History of migraines  Obesity, There is no height or weight on file to calculate BMI.   Hospital day # 2  TREATMENT/PLAN  Continue aspirin 325 mg orally every day for secondary  stroke prevention.  Rehab  - recommend OP followup given family situation  TEE scheduled for 10a today canceled due to holiday. Recommend TEE to look for embolic source. Given holiday, have asked [facility] to add to their list for Monday 05/14/14. If patient remains hospitalized, will do TEE Monday, otherwise they will call her an schedule her to be done as an outpatient in the future. If TEE positive for PFO (patent foramen ovale), check bilateral lower extremity venous dopplers to rule out DVT as possible source of stroke.  F/u rest of hypercoagulable tests (unremarkable so far). Question if we should add RPR and HIV as part of workup. F/u TCD w/ bubble, LE venous dopplers Recommend IUD removal  SIGNED Burnetta Sabin, MSN, RN, ANVP-BC, ANP-BC, GNP-BC [facility] Pager: [phone] 05/11/2014 2:00 PM  I have personally obtained a history, examined the patient,  reviewed lab and  imaging studies, and formulated the assessment and plan of care. I agree with the above.  Antony Contras, MD   To contact Stroke Continuity provider, please refer to [email or web address] After hours, contact General Neurology

## 2014-05-11 NOTE — Evaluation (Signed)
Speech Language Pathology Evaluation Patient Details Name: Kristina [name] MRN: [id number] DOB: [birth year] Today's Date: 05/11/2014 Time: [id number] SLP Time Calculation (min): 18 min  Problem List:  Patient Active Problem List   Diagnosis Date Noted  . CVA (cerebral infarction) 05/09/2014  . ARF (acute renal failure) 05/09/2014   Past Medical History: History reviewed. No pertinent past medical history. Past Surgical History: History reviewed. No pertinent past surgical history. HPI:  28 y.o. female with complaints of dizziness and left hand and leg weakness.  CT head reviewed and shows an area of hypodensity in the right insular cortex.  Acute infarct is suspected.  Further work up recommended. MRI revealed acute Rt MCA infarct.     Assessment / Plan / Recommendation Clinical Impression  Pt exhibits decreased emergent awareness of cognitive impairments, including decreased sustained attention, complex problem solving, working memory, and retrieval of new information. Pt will benefit from acute speech therapy as well as OP SLP services to maximize functional independence. Recommend intermittent supervision upon d/c home.    SLP Assessment  Patient needs continued Speech Lanaguage Pathology Services    Follow Up Recommendations  Outpatient SLP;Other (comment) (intermittent supervision)    Frequency and Duration min 1 x/week  1 week   Pertinent Vitals/Pain n/a   SLP Goals  SLP Goals Potential to Achieve Goals: Good  SLP Evaluation Prior Functioning  Cognitive/Linguistic Baseline: Baseline deficits Baseline deficit details: Pt was functioning independently, although reported trouble remembering things Type of Home: Apartment Available Help at Discharge: Family;Available PRN/intermittently Vocation: Unemployed   Cognition  Overall Cognitive Status: Impaired/Different from baseline Arousal/Alertness: Awake/alert Orientation Level: Oriented X4 Attention:  Sustained Sustained Attention: Impaired Sustained Attention Impairment: Functional complex;Verbal complex Memory: Impaired Memory Impairment: Decreased recall of new information;Retrieval deficit Awareness: Impaired Awareness Impairment: Emergent impairment;Anticipatory impairment Problem Solving: Impaired Problem Solving Impairment: Functional complex;Verbal complex Safety/Judgment: Appears intact    Comprehension  Auditory Comprehension Overall Auditory Comprehension: Appears within functional limits for tasks assessed Visual Recognition/Discrimination Discrimination: Not tested Reading Comprehension Reading Status: Not tested    Expression Expression Primary Mode of Expression: Verbal Verbal Expression Overall Verbal Expression: Appears within functional limits for tasks assessed Written Expression Written Expression: Not tested   Oral / Motor Motor Speech Overall Motor Speech: Appears within functional limits for tasks assessed   GO       Kristina [name], M.A. CCC-SLP [phone]  Kristina [name], Kristina [name] 05/11/2014, 2:07 PM

## 2014-05-11 NOTE — Progress Notes (Addendum)
I met with pt and her Step Dad at bedside. I discussed inpt rehab admission vs outpt therapy follow up. Their concern is that Step Dad and Mom work during the day and pt would be alone. We discussed the possibility of cousins coming to stay with her during the day while they worked. Cost of inpt rehab admission also discussed unless Medicaid could be obtained which her Elenor Legato is working on. Noted TEE at 10 am today. I will follow up after TEE to assist with dispo planning. Dr. Letta Pate feels outpt therapy most appropriate and short inpt rehab would not resolve issue for need of supervision at home at d/c. [phone]

## 2014-05-11 NOTE — Progress Notes (Addendum)
[facility] PROGRESS NOTE  Kristina [name] WUJ:[id number] DOB: [birth year] DOA: 05/09/2014 PCP: No primary provider on file.  Assessment/Plan: Acute R MCA CVA  -Carotid Dopplers-1-39% stenosis -2-D echo: Normal EF, small atrial septum aneurysm -TEE pending -hypercoagulable panel negative, ANA negative -Urine pregnancy negative -I have advised her to get [facility], [facility] -Neuro following -ASA 325mg  daily, LDL 74, hbaic 5.4 -PT/OT eval ongoing (call [phone], prior to DC to schedule outpt TEE)  Acute renal failure  -Likely prerenal azotemia.  -Hydrate, IVF  DVT prophylaxis  -Lovenox  Code Status: Full Code Family Communication: stepdad at bedside Disposition Plan: rehab, unlcear   Consultants:  NEuro  HPI/Subjective: L sided strength improving  Objective: Filed Vitals:   05/11/14 1000  BP: 118/70  Pulse: 76  Temp: 98.1 F (36.7 C)  Resp: 18    Intake/Output Summary (Last 24 hours) at 05/11/14 1209 Last data filed at 05/11/14 0900  Gross per 24 hour  Intake    720 ml  Output      0 ml  Net    720 ml   There were no vitals filed for this visit.  Exam:   General:  AAOx3  Cardiovascular: S1S2/RRR  Respiratory: CTAB  Abdomen: soft, NT, BS present  Musculoskeletal: no edema c/c   Neuro: LLE 4/5, LUE 4/5  Data Reviewed: Basic Metabolic Panel:  Recent Labs Lab 05/09/14 1026  NA 136*  K 4.1  CL 101  CO2 22  GLUCOSE 83  BUN 10  CREATININE 1.28*  CALCIUM 8.9   Liver Function Tests: No results found for this basename: AST, ALT, ALKPHOS, BILITOT, PROT, ALBUMIN,  in the last 168 hours No results found for this basename: LIPASE, AMYLASE,  in the last 168 hours No results found for this basename: AMMONIA,  in the last 168 hours CBC:  Recent Labs Lab 05/09/14 1026  WBC 6.7  NEUTROABS 2.9  HGB 14.7  HCT 43.1  MCV 86.9  PLT 257   Cardiac Enzymes: No results found for this basename: CKTOTAL, CKMB,  CKMBINDEX, TROPONINI,  in the last 168 hours BNP (last 3 results) No results found for this basename: PROBNP,  in the last 8760 hours CBG: No results found for this basename: GLUCAP,  in the last 168 hours  Recent Results (from the past 240 hour(s))  MRSA PCR SCREENING     Status: None   Collection Time    05/10/14  1:07 PM      Result Value Ref Range Status   MRSA by PCR NEGATIVE  NEGATIVE Final   Comment:            The GeneXpert MRSA Assay (FDA     approved for NASAL specimens     only), is one component of a     comprehensive MRSA colonization     surveillance program. It is not     intended to diagnose MRSA     infection nor to guide or     monitor treatment for     MRSA infections.     Studies: Dg Chest 2 View  05/09/2014   CLINICAL DATA:  28 year old female with headache and dizziness. Abnormal right insula. Initial encounter.  EXAM: CHEST  2 VIEW  COMPARISON:  None.  FINDINGS: Mildly low lung volumes. Normal cardiac size and mediastinal contours. Visualized tracheal air column is within normal limits. Other than mild crowding of markings, the lungs are clear. No pneumothorax or effusion. Except  for mild scoliosis negative visualized osseous structures.  IMPRESSION: Low lung volumes, otherwise negative.   Electronically Signed   By: Augusto Gamble M.D.   On: 05/09/2014 15:25   Mr Brain Wo Contrast  05/09/2014   CLINICAL DATA:  Stroke. Dizziness and left hand and left leg weakness.  EXAM: MRI HEAD WITHOUT CONTRAST  MRA HEAD WITHOUT CONTRAST  TECHNIQUE: Multiplanar, multiecho pulse sequences of the brain and surrounding structures were obtained without intravenous contrast. Angiographic images of the head were obtained using MRA technique without contrast.  COMPARISON:  Head CT 05/09/2014  FINDINGS: MRI HEAD FINDINGS  Slight cerebellar tonsillar ectopia is noted, measuring 4 mm just left of midline. There is an acute infarct measuring approximately 3 x 1.5 x 2.5 cm involving the right  posterior insula, subinsular white matter tracts, and a small portion of the putamen. Small foci of acute infarction are also present more superiorly and posteriorly in the right hemisphere involving posterior temporal lobe cortex, optic radiation, posterior aspects of the corona radiata and centrum semiovale, and subcortical parietal white matter. There is no evidence of intracranial hemorrhage. There is mild edema associated with these areas of infarct without significant mass effect. There is no mass, midline shift or extra-axial fluid collection. Ventricles and sulci are within normal limits. Incidental note is made of a cavum velum interpositum.  Orbits are unremarkable. There is moderate right maxillary sinus mucosal thickening with a small amount of sinus fluid. Focal left anterior ethmoid air cell opacification is noted. Mastoid air cells are clear.  MRA HEAD FINDINGS  Visualized distal vertebral arteries are patent with the right being mildly dominant. Left PICA origin is patent. Right PICA is not definitely identified. Right AICA is dominant. SCA origins are patent. Basilar artery is patent without stenosis. PCAs are unremarkable. There are small, patent posterior communicating arteries bilaterally.  Internal carotid arteries are patent from skullbase to carotid termini. Right right M1 segment is patent. Anterior/superior M2 division appears patent. However, there is diminished flow in and occlusion of posterior right M2 branches. Left MCA and bilateral ACAs are unremarkable. Anterior communicating artery is patent. No intracranial aneurysm is identified.  IMPRESSION: 1. Acute right MCA territory infarct as above, with greatest involvement of the posterior insula. 2. Occlusion of posterior M2 division branches of the right MCA.   Electronically Signed   By: Sebastian Ache   On: 05/09/2014 20:31   Mr Maxine Glenn Head/brain Wo Cm  05/09/2014   CLINICAL DATA:  Stroke. Dizziness and left hand and left leg weakness.   EXAM: MRI HEAD WITHOUT CONTRAST  MRA HEAD WITHOUT CONTRAST  TECHNIQUE: Multiplanar, multiecho pulse sequences of the brain and surrounding structures were obtained without intravenous contrast. Angiographic images of the head were obtained using MRA technique without contrast.  COMPARISON:  Head CT 05/09/2014  FINDINGS: MRI HEAD FINDINGS  Slight cerebellar tonsillar ectopia is noted, measuring 4 mm just left of midline. There is an acute infarct measuring approximately 3 x 1.5 x 2.5 cm involving the right posterior insula, subinsular white matter tracts, and a small portion of the putamen. Small foci of acute infarction are also present more superiorly and posteriorly in the right hemisphere involving posterior temporal lobe cortex, optic radiation, posterior aspects of the corona radiata and centrum semiovale, and subcortical parietal white matter. There is no evidence of intracranial hemorrhage. There is mild edema associated with these areas of infarct without significant mass effect. There is no mass, midline shift or extra-axial fluid collection. Ventricles and  sulci are within normal limits. Incidental note is made of a cavum velum interpositum.  Orbits are unremarkable. There is moderate right maxillary sinus mucosal thickening with a small amount of sinus fluid. Focal left anterior ethmoid air cell opacification is noted. Mastoid air cells are clear.  MRA HEAD FINDINGS  Visualized distal vertebral arteries are patent with the right being mildly dominant. Left PICA origin is patent. Right PICA is not definitely identified. Right AICA is dominant. SCA origins are patent. Basilar artery is patent without stenosis. PCAs are unremarkable. There are small, patent posterior communicating arteries bilaterally.  Internal carotid arteries are patent from skullbase to carotid termini. Right right M1 segment is patent. Anterior/superior M2 division appears patent. However, there is diminished flow in and occlusion of  posterior right M2 branches. Left MCA and bilateral ACAs are unremarkable. Anterior communicating artery is patent. No intracranial aneurysm is identified.  IMPRESSION: 1. Acute right MCA territory infarct as above, with greatest involvement of the posterior insula. 2. Occlusion of posterior M2 division branches of the right MCA.   Electronically Signed   By: Sebastian AcheAllen  Grady   On: 05/09/2014 20:31    Scheduled Meds: .  stroke: mapping our early stages of recovery book   Does not apply Once  . aspirin  300 mg Rectal Daily   Or  . aspirin  325 mg Oral Daily  . enoxaparin (LOVENOX) injection  40 mg Subcutaneous Q24H   Continuous Infusions:  Antibiotics Given (last 72 hours)   None      Principal Problem:   CVA (cerebral infarction) Active Problems:   ARF (acute renal failure)    Time spent: 35min    [facility] HospitalJOSEPH,Rhonna Holster  [facility] Pager [phone]. If 7PM-7AM, please contact night-coverage at [email or web address], password [facility] [email or web address] 05/11/2014, 12:09 PM  LOS: 2 days

## 2014-05-11 NOTE — Progress Notes (Signed)
Physical Therapy Treatment Patient Details Name: Stormy CardFelecity Hunsinger MRN: [id number] DOB: 06-28-1986 Today's Date: 05/11/2014    History of Present Illness 28 y.o. female with complaints of dizziness and left hand and leg weakness.  CT head reviewed and shows an area of hypodensity in the right insular cortex.  Acute infarct is suspected.  Further work up recommended. MRI revealed acute Rt MCA infarct.     PT Comments    Session focused on progressing mobility. Pt was able to increase ambulation with RW and min guard. Pt continues to demo steppage/ataxic gt with Lt LE. Educated thoroughly on home setup techniques to reduce risk of falls and multiple handouts given regarding stroke information (signs, symptoms and risk factors) and reviewed with pt. Will benefit from additional session prior to D/C to further educate pt and educate family on guarding/ (A) techniques.   Follow Up Recommendations  Outpatient PT;Supervision/Assistance - 24 hour     Equipment Recommendations  Rolling walker with 5" wheels    Recommendations for Other Services       Precautions / Restrictions Precautions Precautions: Fall Restrictions Weight Bearing Restrictions: No    Mobility  Bed Mobility Overal bed mobility: Modified Independent             General bed mobility comments: incr time; no physical (A) needed; pt has to (A) Lt UE at times with movement   Transfers Overall transfer level: Needs assistance Equipment used: Rolling walker (2 wheeled) Transfers: Sit to/from Stand Sit to Stand: Supervision         General transfer comment: supervision with transfers; cues for hand placement and sequencing with RW  Ambulation/Gait Ambulation/Gait assistance: Min guard Ambulation Distance (Feet): 110 Feet Assistive device: Rolling walker (2 wheeled) Gait Pattern/deviations: Steppage;Decreased stance time - left;Decreased step length - right;Ataxic;Drifts right/left;Narrow base of support;Trunk  flexed Gait velocity: cues for safe gt speed   General Gait Details: min guard to steady pt with gt belt and to manage RW at times; pt with difficulty negoitating objects; cues for safety with gt and sequencing; pt continues to compensate for Lt LE weakness with steppage gt; may benefit from AFO upon OP evaluation    Stairs            Wheelchair Mobility    Modified Rankin (Stroke Patients Only) Modified Rankin (Stroke Patients Only) Pre-Morbid Rankin Score: No symptoms Modified Rankin: Moderately severe disability     Balance Overall balance assessment: Needs assistance Sitting-balance support: Feet supported;No upper extremity supported Sitting balance-Leahy Scale: Good Sitting balance - Comments: tolerated sitting EOB ~5 min; no c/o dizzines;; was able to reach down towards socks    Standing balance support: During functional activity;Bilateral upper extremity supported Standing balance-Leahy Scale: Poor Standing balance comment: RW for UE support             High level balance activites: Head turns;Sudden stops;Direction changes;Turns High Level Balance Comments: pt c/o dizziness with head turns (vertically and horizontally) compensation technique discussed; pt able to pick object up off ground with min guard (A)     Cognition Arousal/Alertness: Awake/alert Behavior During Therapy: Flat affect Overall Cognitive Status: Impaired/Different from baseline Area of Impairment: Safety/judgement         Safety/Judgement: Decreased awareness of safety;Decreased awareness of deficits          Exercises      General Comments General comments (skin integrity, edema, etc.): multiple handouts provided regarding stroke signs and symptoms and risk factors; provided education regarding house setup for  safety; ( night lighting, throw rugs put away) - will benefit from additional education/mobility session with family present [facility]       Pertinent Vitals/Pain C/0  8/10 HA; premedicated     Home Living     Available Help at Discharge: Family;Available PRN/intermittently Type of Home: Apartment              Prior Function            PT Goals (current goals can now be found in the care plan section) Acute Rehab PT Goals Patient Stated Goal: to go home PT Goal Formulation: With patient Time For Goal Achievement: 05/17/14 Potential to Achieve Goals: Good Progress towards PT goals: Progressing toward goals    Frequency  Min 4X/week    PT Plan Discharge plan needs to be updated    Co-evaluation             End of Session Equipment Utilized During Treatment: Gait belt Activity Tolerance: Patient tolerated treatment well Patient left: in bed;with call bell/phone within reach;with bed alarm set     Time: 1426-1450 PT Time Calculation (min): 24 min  Charges:  $Gait Training: 8-22 mins $Therapeutic Activity: 8-22 mins                    G CodesDonell Sievert:      Marycruz Boehner N, South CarolinaPT  [phone] 05/11/2014, 4:26 PM

## 2014-05-12 LAB — HIV ANTIBODY (ROUTINE TESTING W REFLEX): HIV: NONREACTIVE

## 2014-05-12 LAB — RPR

## 2014-05-12 MED ORDER — ASPIRIN 325 MG PO TABS: 325.0000 mg | ORAL_TABLET | Freq: Every day | ORAL | Status: AC

## 2014-05-12 NOTE — Progress Notes (Deleted)
Stroke Team Progress Note  HISTORY Kristina [name] is a 28 y.o. female who noted on Monday 05/07/2014 to have a headache located in the bilateral frontal region. This was followed by a sensation of something crawling on her right arm. She later reported  that it was her left arm touching her right arm but she did not realize she was touching her arm. She did not seek medical attention at that time. On Tuesday she continued to have a HA associated with dizziness and weak left hand grip. On 05/09/2014 she noted the symptoms again as well as difficulty walking as she would vere to the left. She decided to seek medical attention. Patient obtained [facility] CT head [facility] ED which showed acute infarct in the right insular cortex at the level of basal ganglia. Patient was transferred to [facility] hospital. She was last known well 05/07/2014, unable to determine time of onset. Patient was not administered TPA secondary to delay in arrival. She was admitted 05/09/2014 for further evaluation and treatment.  SUBJECTIVE   OBJECTIVE Most recent Vital Signs: Filed Vitals:   05/11/14 1808 05/11/14 2121 05/12/14 0126 05/12/14 0540  BP: 120/68 122/77 103/70 126/36  Pulse: 64 57 58 62  Temp: 97.8 F (36.6 C) 97.8 F (36.6 C) 98.1 F (36.7 C) 98.4 F (36.9 C)  TempSrc: Oral Oral Oral Oral  Resp: _0 Height:    5' (1.524 m)  Weight:    182 lb 12.2 oz (82.9 kg)  SpO2: 100% 99% 98% 100%   CBG (last 3)  No results found for this basename: GLUCAP,  in the last 72 hours  IV Fluid Intake:     MEDICATIONS  .  stroke: mapping our early stages of recovery book   Does not apply Once  . aspirin  300 mg Rectal Daily   Or  . aspirin  325 mg Oral Daily  . enoxaparin (LOVENOX) injection  40 mg Subcutaneous Q24H   PRN:  acetaminophen, acetaminophen, HYDROcodone-acetaminophen, senna-docusate  Diet:  General thin liquids Activity:  OOB with assistance DVT Prophylaxis:  Lovenox 40 mg sq daily   CLINICALLY SIGNIFICANT  STUDIES Basic Metabolic Panel:   Recent Labs Lab 05/09/14 1026  NA 136*  K 4.1  CL 101  CO2 22  GLUCOSE 83  BUN 10  CREATININE 1.28*  CALCIUM 8.9   Liver Function Tests: No results found for this basename: AST, ALT, ALKPHOS, BILITOT, PROT, ALBUMIN,  in the last 168 hours CBC:   Recent Labs Lab 05/09/14 1026  WBC 6.7  NEUTROABS 2.9  HGB 14.7  HCT 43.1  MCV 86.9  PLT 257   Coagulation: No results found for this basename: LABPROT, INR,  in the last 168 hours Cardiac Enzymes: No results found for this basename: CKTOTAL, CKMB, CKMBINDEX, TROPONINI,  in the last 168 hours Urinalysis: No results found for this basename: COLORURINE, APPERANCEUR, LABSPEC, PHURINE, GLUCOSEU, HGBUR, BILIRUBINUR, KETONESUR, PROTEINUR, UROBILINOGEN, NITRITE, LEUKOCYTESUR,  in the last 168 hours Lipid Panel    Component Value Date/Time   CHOL 132 05/10/2014 0430   TRIG 86 05/10/2014 0430   HDL 41 05/10/2014 0430   CHOLHDL 3.2 05/10/2014 0430   VLDL 17 05/10/2014 0430   LDLCALC 74 05/10/2014 0430   HgbA1C  Lab Results  Component Value Date   HGBA1C 5.6 05/10/2014    Urine Drug Screen:   No results found for this basename: labopia,  cocainscrnur,  labbenz,  amphetmu,  thcu,  labbarb  Alcohol Level: No results found for this basename: ETH,  in the last 168 hours  Hypercoagulable labs  HIV - non-reactive RPR - non-reactive Antithrombin III - WNL Protein C activity - 142 (H) Protein C total - pending Protein S. Activity - 81 WNL Protein S. Total - pending Lupus anticoagulant - none detected Beta 2 glycoprotein - pending Homocysteine - 8.8 WNL Cardiolipin antibodies - IgG 7 (L) - IgM 5 (L) -  IgA 5 (L) ANA- negative      CT of the brain  05/09/2014    Findings felt to represent acute infarct the region of the insular cortex on the right at the level of the basal ganglia. No hemorrhage or mass effect. Air-fluid level in right maxillary antrum.   MRI of the brain  05/09/2014     Acute right MCA  territory infarct as above, with greatest involvement of the posterior insula.  MRA of the brain   05/09/2014     Occlusion of posterior M2 division branches of the right MCA.     Carotid Doppler  No evidence of hemodynamically significant internal carotid artery stenosis. Vertebral artery flow is antegrade.   TCD w/ bubble - pending  LE Venous Dopplers  pending  2D Echocardiogram  EF 55-60% with no source of embolus.   TEE pending  CXR  05/09/2014    Low lung volumes, otherwise negative.     EKG  Sinus arrhythmia. For complete results please see formal report.   Therapy Recommendations - physical therapy recommends outpatient PT. Occupational therapy recommends CIR. Speech recommends outpatient speech therapy.  Physical Exam   Obese young african lady in acute distress.Awake alert. Afebrile. Head is nontraumatic. Neck is supple. Hearing is grossly normal. Cardiac exam with HRR. no murmur or gallop. Lungs are clear to auscultation. Distal pulses are 2+. Neurological Exam : Awake alert oriented x3 with normal speech and language function. Fundi were not visualized. Vision acuity and fields are adequate. Extraocular movements intact. No nystagmus. Face is symmetric without weakness. Tongue is midline. Motor system exam reveals left UE drift. Diminished fine finger movements on the left. Orbits right over left upper extremity. Mild left finger-to-nose dysmetria. Symmetric strength and coordination in the lower extremities. Mild subjective left hemisensory loss greater in LLE than LUE; symmetric in face.   ASSESSMENT [name] is a 28 y.o. female presenting with HA, left hand weakness, dizziness, difficulty walking. Imaging confirms a right MCA infarct. Patient presentation does not correspond to identified infarct. Likey with brainstem lesion not seen on MRI. Infarct felt to be embolic secondary to unknown source.  On no antithrombotic prior to admission. Now on aspirin 325 mg orally  every day for secondary stroke prevention. Patient with resultant left hemiparesis, ataxia, headache. Stroke work up in the young underway. Hypercoagulable panel thus far unrevealing, Protein C activity elevated (not related).   Etoh use  IUD (Mirena, levonorgestrel-releasing intrauterine system) in place  History of migraines  Obesity, Body mass index is 35.69 kg/(m^2).   Mild renal insufficiency  Hospital day # 3  TREATMENT/PLAN  Continue aspirin 325 mg orally every day for secondary stroke prevention.  Rehab  - inpatient rehabilitation versus outpatient therapies.  TEE scheduled for 10a today canceled due to holiday. Recommend TEE to look for embolic source. Given holiday, have asked [facility] to add to their list for Monday 05/14/14. If patient remains hospitalized, will do TEE Monday, otherwise they will call her and schedule her to be  done as an outpatient in the future. If TEE positive for PFO (patent foramen ovale), check bilateral lower extremity venous dopplers to rule out DVT as possible source of stroke.  F/u rest of hypercoagulable tests (unremarkable so far). Question if we should add RPR and HIV as part of workup. F/u TCD w/ bubble, LE venous dopplers Recommend IUD removal  SIGNED Mikey Bussing PA-C [facility] Pager [phone] 05/12/2014, 8:46 AM  I have personally obtained a history, examined the patient, reviewed lab and  imaging studies, and formulated the assessment and plan of care. I agree with the above.     To contact Stroke Continuity provider, please refer to [email or web address] After hours, contact General Neurology

## 2014-05-12 NOTE — Progress Notes (Signed)
Occupational Therapy Treatment Patient Details Name: Kristina [name] MRN: [id number] DOB: 08/10/1986 Today's Date: 05/12/2014    History of present illness Kristina [name] is a 28 y.o. Female admitted 05/09/14 with c/o dizziness and L sided weakness. CT revealed an area of hypodensity in Rt insular cortex. MRI found an acute right MCA territory infarct.   OT comments  Pt seen today to address LUE weakness, decreased saccades, and L side inattention to increase independence with ADLs. Pt required Supervision to ambulate to the bathroom for toileting with use of RW. Pt reports that her L hand is "almost back to normal." Pt participated in saccade exercises and education provided to pt on how to create and perform at home. Pt looked around room to identify items needed for ADLs.    Follow Up Recommendations  Supervision/Assistance - 24 hour;Other (comment); pt is now requesting HHPT (rather than outpatient) as she does not feel she can drive due to her vision. Pt is prioritizing PT needs as she does not have insurance and can only afford one home therapy. Encouraged pt to complete vision exercises at home and to follow up with her eye doctor ASAP, possibly for a referral to a neuro ophthalmologist.    Equipment Recommendations  None recommended by OT       Precautions / Restrictions Precautions Precautions: Fall Restrictions Weight Bearing Restrictions: No       Mobility Bed Mobility Overal bed mobility: Modified Independent             General bed mobility comments: increased time; no physical (A) needed  Transfers Overall transfer level: Needs assistance Equipment used: Rolling walker (2 wheeled) Transfers: Sit to/from Stand Sit to Stand: Supervision         General transfer comment: Supervision for safety        ADL                           Toilet Transfer: Supervision/safety;Regular Toilet;RW   Toileting- ArchitectClothing Manipulation and Hygiene:  Supervision/safety;Sit to/from stand         General ADL Comments: Pt with increased ability to maneuver around environment, however appears to have difficulty managing RW smoothly.       Vision                 Additional Comments: Provided pt with saccade eye movement worksheet with directions written on back. Pt read directions and performed task (reading across two columns down the sides of a page). Encouraged pt to continue this at home to improve saccadic movements. Demonstrated how to create worksheet with pt teach back. Pt practiced looking around room to identify items needed for ADLs.           Cognition  Arousal/Alertness: Awake/Alert Behavior During Therapy: Flat affect Overall Cognitive Status: Impaired/Different from baseline Area of Impairment: Safety/judgement          Safety/Judgement: Decreased awareness of safety;Decreased awareness of deficits     General Comments: Pt continues to demonstrate flat affect, however does not appear agitated or irritated this morning.                  Pertinent Vitals/ Pain       NAD         Frequency Min 3X/week     Progress Toward Goals  OT Goals(current goals can now be found in the care plan section)  Progress towards OT goals: Progressing toward goals  Acute  Rehab OT Goals Patient Stated Goal: to go home  Plan Discharge plan needs to be updated       End of Session Equipment Utilized During Treatment: Rolling walker   Activity Tolerance Patient tolerated treatment well   Patient Left in bed;with call bell/phone within reach           Time: 0824-0834 OT Time Calculation (min): 10 min  Charges: OT General Charges $OT Visit: 1 Procedure OT Treatments $Therapeutic Activity: 8-22 mins  Rae LipsMiller, Bao Bazen M [phone] 05/12/2014, 9:07 AM

## 2014-05-12 NOTE — Discharge Summary (Signed)
Physician Discharge Summary  Kristina [name]:[id number] DOB: 01-28-1986 DOA: 05/09/2014  PCP: No primary provider on file.  Admit date: 05/09/2014 Discharge date: 05/12/2014  Time spent: 45 minutes  Recommendations for Outpatient Follow-up:  1. Dr.Matthews 7/28 2. Outpatient TEE next week, [place] [facility] with call with appt 3. Remove MIRENA IUD 4. Dr.Xu at stroke Center in 1 month 5. If TEE positive for PFO will need lower ext venous duplex  Discharge Diagnoses:   Acute R MCA CVA   ARF (acute renal failure)   Obesity, unspecified   IUD (intrauterine device) in place   Discharge Condition: stable  Diet recommendation: regular  Filed Weights   05/12/14 0540  Weight: 82.9 kg (182 lb 12.2 oz)    History of present illness:  Patient is a pleasant 28 year old young lady without significant past medical history who states that Monday afternoon (2 days prior to admission) she developed a significant headache which evolved into dizziness. She later states that she felt like things were crawling on her left arm. She mentioned this to her mother who told her to take a shower which she did and then went to bed. She awoke Tuesday morning and noticed that the dizziness remained and she also noticed decreased left grip strength to where she was unable to open doorknobs and also noticed that she was falling to the left side. She decides to come into the hospital today for evaluation where a CT scan of the head shows an acute infarct of the insular cortex on the right at the level of the basal ganglia without hemorrhage or mass effect.  Hospital Course:  Acute R MCA CVA  -admitted with L sided weakness and numbness which has imporved -Carotid Dopplers-1-39% stenosis  -2-D echo: Normal EF, small atrial septum aneurysm  -TEE could not be completed due to Long weekend, will be done as outpatient, D/w  cardiology -hypercoagulable panel negative, ANA negative  -HIV/RPR  negative -Urine pregnancy negative  -I have advised her to get [facility], [facility]  -Followed by NEurology this admission, recommend outpatient TEE, ASA and FU with NEurology -ASA 325mg  daily, LDL 74, hbaic 5.4  -PT/OT eval completed, deficits improving, Home health Pt set up  Acute renal failure  -mild, likley prerenal azotemia.  -Hydrated with IVF      Consultations:  Neurology  Discharge Exam: Filed Vitals:   05/12/14 1016  BP: 114/81  Pulse: 85  Temp: 98 F (36.7 C)  Resp: 18    General: AAOx3 Cardiovascular: S1S2/RRR Respiratory: CTAB  Discharge Instructions You were cared for by a hospitalist during your hospital stay. If you have any questions about your discharge medications or the care you received while you were in the hospital after you are discharged, you can call the unit and asked to speak with the hospitalist on call if the hospitalist that took care of you is not available. Once you are discharged, your primary care physician will handle any further medical issues. Please note that NO REFILLS for any discharge medications will be authorized once you are discharged, as it is imperative that you return to your primary care physician (or establish a relationship with a primary care physician if you do not have one) for your aftercare needs so that they can reassess your need for medications and monitor your lab values.  Discharge Instructions   Diet general    Complete by:  As directed  Discharge instructions    Complete by:  As directed   [facility]     Increase activity slowly    Complete by:  As directed             Medication List    STOP taking these medications       levonorgestrel 20 MCG/24HR IUD  Commonly known as:  MIRENA      TAKE these medications       aspirin 325 MG tablet  Take 1 tablet (325 mg total) by mouth daily.     ibuprofen 200 MG tablet  Commonly known  as:  ADVIL,MOTRIN  Take 600 mg by mouth every 6 (six) hours as needed (pain). pain       No Known Allergies     Follow-up Information   Follow up with Dr Karrie MeresMichelle Matthew On 06/05/2014. (at 3:45 pm- she is located in the sickle cell clinic- YOU DO NOT HAVE SICKLE CELL)    Contact information:   [address] [place] [address] [phone]      [facility] with [facility]     On 06/07/2014. (at 11:30 am for an "orange [name]" to assist you with your medications;)    Contact information:   [address] [place] [address] [phone]      Follow up with Xu,Jindong, MD. Schedule an appointment as soon as possible for a visit in 1 month. (Stroke Clinic)    Specialty:  Neurology   Contact information:   [address] [place] [address] [phone]       Follow up with TEE. ([facility])       Follow up with [facility]. (home health physical therapy)    Contact information:   [address] [place] [address] [phone]        The results of significant diagnostics from this hospitalization (including imaging, microbiology, ancillary and laboratory) are listed below for reference.    Significant Diagnostic Studies: Dg Chest 2 View  05/09/2014   CLINICAL DATA:  28 year old female with headache and dizziness. Abnormal right insula. Initial encounter.  EXAM: CHEST  2 VIEW  COMPARISON:  None.  FINDINGS: Mildly low lung volumes. Normal cardiac size and mediastinal contours. Visualized tracheal air column is within normal limits. Other than mild crowding of markings, the lungs are clear. No pneumothorax or effusion. Except for mild scoliosis negative visualized osseous structures.  IMPRESSION: Low lung volumes, otherwise negative.   Electronically Signed   By: Augusto GambleLee  Hall M.D.   On: 05/09/2014 15:25   Ct Head Wo Contrast  05/09/2014   CLINICAL DATA:  Headache  and dizziness ; left-sided numbness  EXAM: CT HEAD WITHOUT CONTRAST  TECHNIQUE: Contiguous axial images were obtained from the base of the skull through the vertex without intravenous contrast.  COMPARISON:  None.  FINDINGS: The ventricles are normal in size and configuration. There is no appreciable mass, hemorrhage, extra-axial fluid collection, or midline shift.  There is decreased attenuation in the region of the insular cortex on the right at the level of the right basal ganglia. This finding is felt to represent an acute infarct in this region. It is best seen on axial slices 11, 12, 13, and 14. Elsewhere gray-white compartments appear normal.  Bony calvarium appears intact. The mastoid air cells are clear. There is an air-fluid level in  the superior right maxillary antrum.  IMPRESSION: Findings felt to represent acute infarct the region of the insular cortex on the right at the level of the basal ganglia. No hemorrhage or mass effect. Air-fluid level in right maxillary antrum.   Electronically Signed   By: Bretta Bang M.D.   On: 05/09/2014 11:12   Mr Brain Wo Contrast  05/09/2014   CLINICAL DATA:  Stroke. Dizziness and left hand and left leg weakness.  EXAM: MRI HEAD WITHOUT CONTRAST  MRA HEAD WITHOUT CONTRAST  TECHNIQUE: Multiplanar, multiecho pulse sequences of the brain and surrounding structures were obtained without intravenous contrast. Angiographic images of the head were obtained using MRA technique without contrast.  COMPARISON:  Head CT 05/09/2014  FINDINGS: MRI HEAD FINDINGS  Slight cerebellar tonsillar ectopia is noted, measuring 4 mm just left of midline. There is an acute infarct measuring approximately 3 x 1.5 x 2.5 cm involving the right posterior insula, subinsular white matter tracts, and a small portion of the putamen. Small foci of acute infarction are also present more superiorly and posteriorly in the right hemisphere involving posterior temporal lobe cortex, optic radiation,  posterior aspects of the corona radiata and centrum semiovale, and subcortical parietal white matter. There is no evidence of intracranial hemorrhage. There is mild edema associated with these areas of infarct without significant mass effect. There is no mass, midline shift or extra-axial fluid collection. Ventricles and sulci are within normal limits. Incidental note is made of a cavum velum interpositum.  Orbits are unremarkable. There is moderate right maxillary sinus mucosal thickening with a small amount of sinus fluid. Focal left anterior ethmoid air cell opacification is noted. Mastoid air cells are clear.  MRA HEAD FINDINGS  Visualized distal vertebral arteries are patent with the right being mildly dominant. Left PICA origin is patent. Right PICA is not definitely identified. Right AICA is dominant. SCA origins are patent. Basilar artery is patent without stenosis. PCAs are unremarkable. There are small, patent posterior communicating arteries bilaterally.  Internal carotid arteries are patent from skullbase to carotid termini. Right right M1 segment is patent. Anterior/superior M2 division appears patent. However, there is diminished flow in and occlusion of posterior right M2 branches. Left MCA and bilateral ACAs are unremarkable. Anterior communicating artery is patent. No intracranial aneurysm is identified.  IMPRESSION: 1. Acute right MCA territory infarct as above, with greatest involvement of the posterior insula. 2. Occlusion of posterior M2 division branches of the right MCA.   Electronically Signed   By: Sebastian Ache   On: 05/09/2014 20:31   Mr Maxine Glenn Head/brain Wo Cm  05/09/2014   CLINICAL DATA:  Stroke. Dizziness and left hand and left leg weakness.  EXAM: MRI HEAD WITHOUT CONTRAST  MRA HEAD WITHOUT CONTRAST  TECHNIQUE: Multiplanar, multiecho pulse sequences of the brain and surrounding structures were obtained without intravenous contrast. Angiographic images of the head were obtained using MRA  technique without contrast.  COMPARISON:  Head CT 05/09/2014  FINDINGS: MRI HEAD FINDINGS  Slight cerebellar tonsillar ectopia is noted, measuring 4 mm just left of midline. There is an acute infarct measuring approximately 3 x 1.5 x 2.5 cm involving the right posterior insula, subinsular white matter tracts, and a small portion of the putamen. Small foci of acute infarction are also present more superiorly and posteriorly in the right hemisphere involving posterior temporal lobe cortex, optic radiation, posterior aspects of the corona radiata and centrum semiovale, and subcortical parietal white matter. There is no evidence of intracranial hemorrhage.  There is mild edema associated with these areas of infarct without significant mass effect. There is no mass, midline shift or extra-axial fluid collection. Ventricles and sulci are within normal limits. Incidental note is made of a cavum velum interpositum.  Orbits are unremarkable. There is moderate right maxillary sinus mucosal thickening with a small amount of sinus fluid. Focal left anterior ethmoid air cell opacification is noted. Mastoid air cells are clear.  MRA HEAD FINDINGS  Visualized distal vertebral arteries are patent with the right being mildly dominant. Left PICA origin is patent. Right PICA is not definitely identified. Right AICA is dominant. SCA origins are patent. Basilar artery is patent without stenosis. PCAs are unremarkable. There are small, patent posterior communicating arteries bilaterally.  Internal carotid arteries are patent from skullbase to carotid termini. Right right M1 segment is patent. Anterior/superior M2 division appears patent. However, there is diminished flow in and occlusion of posterior right M2 branches. Left MCA and bilateral ACAs are unremarkable. Anterior communicating artery is patent. No intracranial aneurysm is identified.  IMPRESSION: 1. Acute right MCA territory infarct as above, with greatest involvement of the  posterior insula. 2. Occlusion of posterior M2 division branches of the right MCA.   Electronically Signed   By: Sebastian AcheAllen  Grady   On: 05/09/2014 20:31    Microbiology: Recent Results (from the past 240 hour(s))  MRSA PCR SCREENING     Status: None   Collection Time    05/10/14  1:07 PM      Result Value Ref Range Status   MRSA by PCR NEGATIVE  NEGATIVE Final   Comment:            The GeneXpert MRSA Assay (FDA     approved for NASAL specimens     only), is one component of a     comprehensive MRSA colonization     surveillance program. It is not     intended to diagnose MRSA     infection nor to guide or     monitor treatment for     MRSA infections.     Labs: Basic Metabolic Panel:  Recent Labs Lab 05/09/14 1026  NA 136*  K 4.1  CL 101  CO2 22  GLUCOSE 83  BUN 10  CREATININE 1.28*  CALCIUM 8.9   Liver Function Tests: No results found for this basename: AST, ALT, ALKPHOS, BILITOT, PROT, ALBUMIN,  in the last 168 hours No results found for this basename: LIPASE, AMYLASE,  in the last 168 hours No results found for this basename: AMMONIA,  in the last 168 hours CBC:  Recent Labs Lab 05/09/14 1026  WBC 6.7  NEUTROABS 2.9  HGB 14.7  HCT 43.1  MCV 86.9  PLT 257   Cardiac Enzymes: No results found for this basename: CKTOTAL, CKMB, CKMBINDEX, TROPONINI,  in the last 168 hours BNP: BNP (last 3 results) No results found for this basename: PROBNP,  in the last 8760 hours CBG: No results found for this basename: GLUCAP,  in the last 168 hours     Signed:  Kyann Heydt  [facility] 05/12/2014, 10:28 AM

## 2014-05-12 NOTE — Care Management Note (Signed)
    Page 1 of 2   05/12/2014     9:41:12 AM CARE MANAGEMENT NOTE 05/12/2014  Patient:  Kristina [name]   Account Number:  [id number]  Date Initiated:  05/10/2014  Documentation initiated by:  Jiles CrockerHANDLER,BRENDA  Subjective/Objective Assessment:   ADMITTED FOR STROKE     Action/Plan:   SEE NOTE BELOW   Anticipated DC Date:  05/12/2014   Anticipated DC Plan:  HOME W HOME HEALTH SERVICES      DC Planning Services  CM consult  [facility]      [facility]   Choice offered to / List presented to:  C-1 Patient   DME arranged  [facility]      DME agency  [facility]     HH arranged  HH[facility] PT      [facility]   Status of service:  Completed, signed off Medicare Important Message given?   (If response is "NO", the following Medicare IM given date fields will be blank) Date Medicare IM given:   Medicare IM given by:   Date Additional Medicare IM given:   Additional Medicare IM given by:    Discharge Disposition:  HOME W HOME HEALTH SERVICES  Per UR Regulation:  Reviewed for med. necessity/level of care/duration of stay  If discussed at Long Length of Stay Meetings, dates discussed:    Comments:  05/12/14 09:35 CM spoke with pt who [facility]  DME delivery rep to deliver RW to room prior to discharge today.  [facility] Judeth CornfieldStephanie and call to DME delivery to notify of no insurance.  No other CM needs were communicated.  [name], BSN, Caryl AdaM [phone].  05/10/2014-Talked to patient about discharge planning; patient lost her job in April 2015 and does not have insurance or PCP; Patient is agreeable to go to Dr Ashley RoyaltyMatthews ( [facility] to [facility] and Dr Ashley RoyaltyMatthews has agreed to see pt with no PCP); apt made for July 28,2015 at 3:45 pm; eligibility apt for orange [name] to assist with [facility] - June 07, 2014 at  11:30 am; patient lives with her mother, pharmacy of choice [facility]; [facility] and she went back to have it rechecked 2014; [name] RN,BSN,MHA

## 2014-05-13 NOTE — Progress Notes (Signed)
TEE postpone until 05/14/14. Donato SchultzSKAINS, Jie Stickels, MD

## 2014-05-14 LAB — PROTEIN C, TOTAL: PROTEIN C, TOTAL: 88 % (ref 72–160)

## 2014-05-14 LAB — PROTEIN S, TOTAL: PROTEIN S AG TOTAL: 97 % (ref 60–150)

## 2014-05-14 SURGERY — ECHOCARDIOGRAM, TRANSESOPHAGEAL
Anesthesia: Moderate Sedation

## 2014-05-22 ENCOUNTER — Telehealth: Payer: Self-pay | Admitting: Cardiology

## 2014-05-22 NOTE — Telephone Encounter (Signed)
New message   TEE was cancel on  7/6 was told someone will be calling back to reschedule

## 2014-05-22 NOTE — Telephone Encounter (Signed)
Left pt a message to call back. Pt had a TEE schedule for 7/6 It looks like it was scheduled when she was in the hospital.

## 2014-05-25 NOTE — Telephone Encounter (Signed)
Left message for pt to call back to schedule TEE -

## 2014-05-29 ENCOUNTER — Encounter: Payer: Self-pay | Admitting: *Deleted

## 2014-05-29 NOTE — Telephone Encounter (Signed)
Follow up          Pt is calling about scheduling TEE / Pt said it is ok for you to set it up and just call her with the date and time

## 2014-05-29 NOTE — Telephone Encounter (Signed)
Pt scheduled for 06/06/14 at 9 am with Dr Delton SeeNelson for TEE, case #[id number]. Left message on pts voicemail of date, time and instructions as asked her to call back to discuss.   Letter of instructions mailed to her home address.

## 2014-06-01 ENCOUNTER — Encounter (HOSPITAL_COMMUNITY): Payer: Self-pay | Admitting: Pharmacy Technician

## 2014-06-05 ENCOUNTER — Ambulatory Visit (INDEPENDENT_AMBULATORY_CARE_PROVIDER_SITE_OTHER): Payer: BC Managed Care – PPO | Admitting: Family Medicine

## 2014-06-05 VITALS — BP 128/81 | HR 72 | Temp 98.1°F | Resp 16 | Ht 60.0 in | Wt 182.0 lb

## 2014-06-05 DIAGNOSIS — Z975 Presence of (intrauterine) contraceptive device: Secondary | ICD-10-CM

## 2014-06-05 DIAGNOSIS — R531 Weakness: Secondary | ICD-10-CM

## 2014-06-05 DIAGNOSIS — I6340 Cerebral infarction due to embolism of unspecified cerebral artery: Secondary | ICD-10-CM

## 2014-06-05 DIAGNOSIS — E669 Obesity, unspecified: Secondary | ICD-10-CM

## 2014-06-05 DIAGNOSIS — M6281 Muscle weakness (generalized): Secondary | ICD-10-CM

## 2014-06-05 DIAGNOSIS — G43009 Migraine without aura, not intractable, without status migrainosus: Secondary | ICD-10-CM

## 2014-06-05 NOTE — Progress Notes (Signed)
Subjective:    Patient ID: Kristina [name], female    DOB: 1986-07-26, 28 y.o.   MRN: [id number]  HPI  Patient is in the office to establish care. She was hospitalized on 05/09/2014 after having a cerebral embolism with a cerebral infarction. She states that she had a significant headache and felt that she had something crawling up and down her left arm. She maintains that the next day she states that she could not turn door knobs, so she was taken to [facility], where it was determines that she had a stroke.   Patient presents with headache. Symptoms began about 3 months ago. Generally, the headaches last about several hours and occur daily. The headache do not seem to be related to any time of the day. The headaches are usually dull and pounding and are frontal in location.  The patient rates her most severe headaches a 7 on a scale from 1 to 10. Recently, the headaches are decreasing in frequency. School attendance or other daily activities are affected by the headaches. Precipitating factors include none which have been determined. The headaches are usually not preceded by an aura. Associated neurologic symptoms which are present include: numbness of extremities. The patient denies depression, speech difficulties, vision problems, vomiting in the early morning and worsening school/work performance.  Symptoms which are not present include: Home treatment has included ibuprofen with good improvement.   Past Medical History  Diagnosis Date  . Stroke 05/07/2014     Review of Systems  Constitutional: Negative for fatigue and unexpected weight change.  Respiratory: Negative.   Cardiovascular: Negative.   Gastrointestinal: Negative.   Genitourinary: Negative.   Musculoskeletal: Negative.   Skin: Negative.   Allergic/Immunologic: Negative.   Neurological: Positive for weakness (left side weakness) and headaches (primarily in the am).  Hematological: Negative.   Psychiatric/Behavioral:  Negative.        Objective:   Physical Exam  Constitutional: She is oriented to person, place, and time. She appears well-developed and well-nourished.  HENT:  Head: Normocephalic and atraumatic.  Right Ear: External ear normal.  Eyes: Conjunctivae and EOM are normal. Pupils are equal, round, and reactive to light.  Neck: Normal range of motion. Neck supple.  Cardiovascular: Normal rate, regular rhythm and normal heart sounds.   Pulmonary/Chest: Effort normal and breath sounds normal.  Abdominal: Soft. Bowel sounds are normal.  Neurological: She is alert and oriented to person, place, and time. She has normal reflexes. No cranial nerve deficit.  Patient has left side weakness  Skin: Skin is warm, dry and intact.  Psychiatric: Her speech is normal and behavior is normal. Judgment and thought content normal. She exhibits a depressed mood (Flat affect).         BP 128/81  Pulse 72  Temp(Src) 98.1 F (36.7 C) (Oral)  Resp 16  Ht 5' (1.524 m)  Wt 182 lb (82.555 kg)  BMI 35.54 kg/m2  LMP 05/28/2014 Assessment & Plan:  1. History of CVA w/left side weakness: Patient was hospitalized previously for a stroke. Patient is scheduled to have a follow-up TEE on 06/06/2014. Patient is to continue daily Aspirin therapy.   2. Migraine Headache: Discussed diet options for.migraine headaches. Patient states that headaches are mildly relieved by OTC Ibuprofen. Kristina [name] is to schedule a follow up neurology appointment.   3. Left side weakness: [facility] physical therapist scheduled in home twice per day. Patient denies any recent falls. She states that overall functioning is improving since  starting therapy.   4. Obesity: Patient to start a low sodium, low fat diet divided over 6 small meals and increase water intake to 6-8 glasses per day.    Opthalmology: 1 year ago Labs: CBC, CMP, TSH  1 week prior to complete physical exam with Dr. Ashley RoyaltyMatthews Birth control-Mirena (awaiting  testing to determine if it needs to be removed) Does not perform monthly self-breast examinations Immunizations: Up to date according to [facility] Last pap smear: 2013, [facility]   No orders of the defined types were placed in this encounter.   Current outpatient prescriptions:aspirin 325 MG tablet, Take 1 tablet (325 mg total) by mouth daily., Disp: , Rfl: ;  B Complex-C (B-COMPLEX WITH VITAMIN C) tablet, Take 1 tablet by mouth daily., Disp: , Rfl:   Massie MaroonHollis,Cristoval Teall M, FNP

## 2014-06-06 ENCOUNTER — Encounter (HOSPITAL_COMMUNITY)
Admission: RE | Disposition: A | Discharge status: Home / Self Care | Payer: Self-pay | Source: Ambulatory Visit | Attending: Cardiology

## 2014-06-06 ENCOUNTER — Encounter (HOSPITAL_COMMUNITY): Payer: Self-pay | Admitting: Gastroenterology

## 2014-06-06 ENCOUNTER — Ambulatory Visit (HOSPITAL_COMMUNITY)
Admission: RE | Admit: 2014-06-06 | Discharge: 2014-06-06 | Disposition: A | Discharge status: Home / Self Care | Payer: Self-pay | Source: Ambulatory Visit | Attending: Cardiology | Admitting: Cardiology

## 2014-06-06 DIAGNOSIS — E669 Obesity, unspecified: Secondary | ICD-10-CM | POA: Insufficient documentation

## 2014-06-06 DIAGNOSIS — I369 Nonrheumatic tricuspid valve disorder, unspecified: Secondary | ICD-10-CM

## 2014-06-06 DIAGNOSIS — I079 Rheumatic tricuspid valve disease, unspecified: Secondary | ICD-10-CM | POA: Insufficient documentation

## 2014-06-06 DIAGNOSIS — Z975 Presence of (intrauterine) contraceptive device: Secondary | ICD-10-CM | POA: Insufficient documentation

## 2014-06-06 DIAGNOSIS — I6350 Cerebral infarction due to unspecified occlusion or stenosis of unspecified cerebral artery: Secondary | ICD-10-CM | POA: Insufficient documentation

## 2014-06-06 DIAGNOSIS — I6340 Cerebral infarction due to embolism of unspecified cerebral artery: Secondary | ICD-10-CM

## 2014-06-06 HISTORY — PX: TEE WITHOUT CARDIOVERSION: SHX5443

## 2014-06-06 HISTORY — DX: Cerebral infarction, unspecified: I63.9

## 2014-06-06 SURGERY — ECHOCARDIOGRAM, TRANSESOPHAGEAL
Anesthesia: Moderate Sedation

## 2014-06-06 MED ORDER — FENTANYL CITRATE 0.05 MG/ML IJ SOLN
INTRAMUSCULAR | Status: AC
  Filled 2014-06-06: qty 2

## 2014-06-06 MED ORDER — MIDAZOLAM HCL 10 MG/2ML IJ SOLN
INTRAMUSCULAR | Status: DC | PRN
  Administered 2014-06-06: 2 mg via INTRAVENOUS
  Administered 2014-06-06: 1 mg via INTRAVENOUS
  Administered 2014-06-06: 2 mg via INTRAVENOUS

## 2014-06-06 MED ORDER — SODIUM CHLORIDE 0.9 % IV SOLN: INTRAVENOUS | Status: DC

## 2014-06-06 MED ORDER — SODIUM CHLORIDE 0.9 % IV SOLN
INTRAVENOUS | Status: DC
Start: 2014-06-06 — End: 2014-06-06
  Administered 2014-06-06: 500 mL via INTRAVENOUS

## 2014-06-06 MED ORDER — FENTANYL CITRATE 0.05 MG/ML IJ SOLN
INTRAMUSCULAR | Status: DC | PRN
  Administered 2014-06-06: 25 ug via INTRAVENOUS
  Administered 2014-06-06: 50 ug via INTRAVENOUS

## 2014-06-06 MED ORDER — MIDAZOLAM HCL 5 MG/ML IJ SOLN
INTRAMUSCULAR | Status: AC
  Filled 2014-06-06: qty 2

## 2014-06-06 MED ORDER — BUTAMBEN-TETRACAINE-BENZOCAINE 2-2-14 % EX AERO
INHALATION_SPRAY | CUTANEOUS | Status: DC | PRN
  Administered 2014-06-06: 2 via TOPICAL

## 2014-06-06 NOTE — Discharge Instructions (Signed)
Transesophageal Echocardiogram °Transesophageal echocardiography (TEE) is a picture test of your heart using sound waves. The pictures taken can give very detailed pictures of your heart. This can help your doctor see if there are problems with your heart. TEE can check: °· If your heart has blood clots in it. °· How well your heart valves are working. °· If you have an infection on the inside of your heart. °· Some of the major arteries of your heart. °· If your heart valve is working after a repair. °· Your heart before a procedure that uses a shock to your heart to get the rhythm back to normal. °BEFORE THE PROCEDURE °· Do not eat or drink for 6 hours before the procedure or as told by your doctor. °· Make plans to have someone drive you home after the procedure. Do not drive yourself home. °· An IV tube will be put in your arm. °PROCEDURE °· You will be given a medicine to help you relax (sedative). It will be given through the IV tube. °· A numbing medicine will be sprayed or gargled in the back of your throat to help numb it. °· The tip of the probe is placed into the back of your mouth. You will be asked to swallow. This helps to pass the probe into your esophagus. °· Once the tip of the probe is in the right place, your doctor can take pictures of your heart. °· You may feel pressure at the back of your throat. °AFTER THE PROCEDURE °· You will be taken to a recovery area so the sedative can wear off. °· Your throat may be sore and scratchy. This will go away slowly over time. °· You will go home when you are fully awake and able to swallow liquids. °· You should have someone stay with you for the next 24 hours. °· Do not drive or operate machinery for the next 24 hours. °Document Released: 08/23/2009 Document Revised: 10/31/2013 Document Reviewed: 04/27/2013 °Exit[employer]® Patient Information ©2015 [employer]. This information is not intended to replace advice given to you by your health care provider. Make  sure you discuss any questions you have with your health care provider. ° °

## 2014-06-06 NOTE — OR Nursing (Signed)
Negative PFO, negative bubble, normal TEE study

## 2014-06-06 NOTE — CV Procedure (Signed)
     Transesophageal Echocardiogram Note  Kristina [name] [id number] 1986/11/03  Procedure: Transesophageal Echocardiogram Indications: Ischemic Stroke  Procedure Details Consent: Obtained Time Out: Verified patient identification, verified procedure, site/side was marked, verified correct patient position, special equipment/implants available, Radiology Safety Procedures followed,  medications/allergies/relevent history reviewed, required imaging and test results available.  Performed  Medications: Fentanyl: 75 mcg Versed: 4 mg  Left Ventrical:  The cavity size was normal. Systolic function was normal. The estimated ejection fraction was in the range of 50-55%. Wall motion was normal; there were no regional wall motion abnormalities.  Mitral Valve: Normal, no MR  Aortic Valve: Normal, trace AI  Tricuspid Valve: Normal, mild TR  Pulmonic Valve: Normal, no PR  Left Atrium/ Left atrial appendage: Normal velocities, no thrombus in LA/LAA/RAA.  Atrial septum: There was no atrial septal aneurysm. No PFO by color Doppler and agitated saline with and without maneuvers.   Aorta: No plague.  No intracardiac source of embolism was identified.    Complications: No apparent complications Patient did tolerate procedure well.  Kristina [name], Kristina Shvartsman H, MD, [facility] 06/06/2014, 6:16 AM

## 2014-06-06 NOTE — Interval H&P Note (Signed)
History and Physical Interval Note:  06/06/2014 6:16 AM  Kristina [name]  has presented today for surgery, with the diagnosis of CVA/RULE OUT PFO  The various methods of treatment have been discussed with the patient and family. After consideration of risks, benefits and other options for treatment, the patient has consented to  Procedure(s): TRANSESOPHAGEAL ECHOCARDIOGRAM (TEE) (N/A) as a surgical intervention .  The patient's history has been reviewed, patient examined, no change in status, stable for surgery.  I have reviewed the patient's chart and labs.  Questions were answered to the patient's satisfaction.     Lars MassonNELSON, Birl Lobello H

## 2014-06-06 NOTE — Progress Notes (Signed)
  Echocardiogram Echocardiogram Transesophageal has been performed.  Hafsa Lohn FRANCES 06/06/2014, 10:43 AM

## 2014-06-06 NOTE — H&P (View-Only) (Signed)
Stroke Team Progress Note  HISTORY Kristina [name] is an 28 y.o. female who noted on Monday 05/07/2014 to have a headache located in the bilateral frontal region. This was followed by a sensation something was crawling on her right arm. She later tells  that it was her left arm touching her right arm but she did not realize she was touching her arm. She did not seek medical attention at that time. On Tuesday she continued to have a HA associated with dizziness and weak left hand grip. Today 05/09/2014 she noted the symptoms again as well as difficulty walking as she would vere to the left. She decided to seek medical attention today. Patient obtained a CT head at [facility] ED which showed acute infarct in the right insular cortex at the level of basal ganglia. Patient was transferred to [facility] hospital. She was last known well 05/07/2014, unable to determine time of onset. Patient was not administered TPA secondary to delay in arrival. She was admitted for further evaluation and treatment.  SUBJECTIVE No family at bedside. Patient playing on her phone. Complains of a headache, no resolved with tylenol.  OBJECTIVE Most recent Vital Signs: Filed Vitals:   05/10/14 2210 05/11/14 0144 05/11/14 0541 05/11/14 1000  BP: 142/102 99/66 109/55 118/70  Pulse: 57 71 83 76  Temp: 97.9 F (36.6 C) 98 F (36.7 C) 98 F (36.7 C) 98.1 F (36.7 C)  TempSrc: Oral Oral Oral Oral  Resp: _0 SpO2: 100% 99% 98% 100%   CBG (last 3)  No results found for this basename: GLUCAP,  in the last 72 hours  IV Fluid Intake:     MEDICATIONS  .  stroke: mapping our early stages of recovery book   Does not apply Once  . aspirin  300 mg Rectal Daily   Or  . aspirin  325 mg Oral Daily  . enoxaparin (LOVENOX) injection  40 mg Subcutaneous Q24H   PRN:  acetaminophen, acetaminophen, HYDROcodone-acetaminophen, senna-docusate  Diet:  General thin liquids Activity:  OOB with assistance DVT Prophylaxis:  Lovenox 40 mg sq  daily   CLINICALLY SIGNIFICANT STUDIES Basic Metabolic Panel:   Recent Labs Lab 05/09/14 1026  NA 136*  K 4.1  CL 101  CO2 22  GLUCOSE 83  BUN 10  CREATININE 1.28*  CALCIUM 8.9   Liver Function Tests: No results found for this basename: AST, ALT, ALKPHOS, BILITOT, PROT, ALBUMIN,  in the last 168 hours CBC:   Recent Labs Lab 05/09/14 1026  WBC 6.7  NEUTROABS 2.9  HGB 14.7  HCT 43.1  MCV 86.9  PLT 257   Coagulation: No results found for this basename: LABPROT, INR,  in the last 168 hours Cardiac Enzymes: No results found for this basename: CKTOTAL, CKMB, CKMBINDEX, TROPONINI,  in the last 168 hours Urinalysis: No results found for this basename: COLORURINE, APPERANCEUR, LABSPEC, PHURINE, GLUCOSEU, HGBUR, BILIRUBINUR, KETONESUR, PROTEINUR, UROBILINOGEN, NITRITE, LEUKOCYTESUR,  in the last 168 hours Lipid Panel    Component Value Date/Time   CHOL 132 05/10/2014 0430   TRIG 86 05/10/2014 0430   HDL 41 05/10/2014 0430   CHOLHDL 3.2 05/10/2014 0430   VLDL 17 05/10/2014 0430   LDLCALC 74 05/10/2014 0430   HgbA1C  Lab Results  Component Value Date   HGBA1C 5.6 05/10/2014    Urine Drug Screen:   No results found for this basename: labopia,  cocainscrnur,  labbenz,  amphetmu,  thcu,  labbarb    Alcohol Level: No  results found for this basename: ETH,  in the last 168 hours   CT of the brain  05/09/2014    Findings felt to represent acute infarct the region of the insular cortex on the right at the level of the basal ganglia. No hemorrhage or mass effect. Air-fluid level in right maxillary antrum.   MRI of the brain  05/09/2014    Acute right MCA territory infarct as above, with greatest involvement of the posterior insula.  MRA of the brain  05/09/2014    Occlusion of posterior M2 division branches of the right MCA.     Carotid Doppler  No evidence of hemodynamically significant internal carotid artery stenosis. Vertebral artery flow is antegrade.   TCD w/ bubble   LE Venous Dopplers     2D Echocardiogram  EF 55-60% with no source of embolus.   TEE   CXR  05/09/2014    Low lung volumes, otherwise negative.     EKG  Sinus arrhythmia. For complete results please see formal report.   Therapy Recommendations CIR  Physical Exam   Obese young african lady in acute distress.Awake alert. Afebrile. Head is nontraumatic. Neck is supple. Hearing is grossly normal. Cardiac exam with HRR. no murmur or gallop. Lungs are clear to auscultation. Distal pulses are 2+. Neurological Exam : Awake alert oriented x3 with normal speech and language function. Fundi were not visualized. Vision acuity and fields are adequate. Extraocular movements intact. No nystagmus. Face is symmetric without weakness. Tongue is midline. Motor system exam reveals left UE drift. Diminished fine finger movements on the left. Orbits right over left upper extremity. Mild left finger-to-nose dysmetria. Symmetric strength and coordination in the lower extremities. Mild subjective left hemisensory loss greater in LLE than LUE; symmetric in face.   ASSESSMENT [name] is a 28 y.o. female presenting with HA, left hand weakness, dizziness, difficulty walking. Imaging confirms a right MCA infarct. Patient presentation does not correspond to identified infarct. Likey with brainstem lesion not seen on MRI. Infarct felt to be embolic secondary to unknown source.  On no antithrombotic prior to admission. Now on aspirin 325 mg orally every day for secondary stroke prevention. Patient with resultant left hemiparesis, ataxia, headache. Stroke work up in the young underway. Hypercoagulable panel thus far unrevealing, Protein C activity elevated (not related).   etoh use  IUD (Mirena, levonorgestrel-releasing intrauterine system) in place  History of migraines  Obesity, There is no height or weight on file to calculate BMI.   Hospital day # 2  TREATMENT/PLAN  Continue aspirin 325 mg orally every day for secondary  stroke prevention.  Rehab  - recommend OP followup given family situation  TEE scheduled for 10a today canceled due to holiday. Recommend TEE to look for embolic source. Given holiday, have asked [facility] to add to their list for Monday 05/14/14. If patient remains hospitalized, will do TEE Monday, otherwise they will call her an schedule her to be done as an outpatient in the future. If TEE positive for PFO (patent foramen ovale), check bilateral lower extremity venous dopplers to rule out DVT as possible source of stroke.  F/u rest of hypercoagulable tests (unremarkable so far). Question if we should add RPR and HIV as part of workup. F/u TCD w/ bubble, LE venous dopplers Recommend IUD removal  SIGNED Burnetta Sabin, MSN, RN, ANVP-BC, ANP-BC, GNP-BC [facility] Pager: [phone] 05/11/2014 2:00 PM  I have personally obtained a history, examined the patient,  reviewed lab and  imaging studies, and formulated the assessment and plan of care. I agree with the above.  Antony Contras, MD   To contact Stroke Continuity provider, please refer to [email or web address] After hours, contact General Neurology

## 2014-06-07 ENCOUNTER — Encounter (HOSPITAL_COMMUNITY): Payer: Self-pay | Admitting: Cardiology

## 2014-06-07 ENCOUNTER — Ambulatory Visit: Payer: Self-pay | Attending: Internal Medicine

## 2014-06-08 ENCOUNTER — Encounter: Payer: Self-pay | Admitting: Family Medicine

## 2014-06-08 DIAGNOSIS — R531 Weakness: Secondary | ICD-10-CM | POA: Insufficient documentation

## 2014-06-08 DIAGNOSIS — G43009 Migraine without aura, not intractable, without status migrainosus: Secondary | ICD-10-CM | POA: Insufficient documentation

## 2014-06-08 NOTE — Telephone Encounter (Signed)
Pt had procedure without complications according to documentation.

## 2014-06-08 NOTE — Patient Instructions (Signed)
Start an 1800 calorie lowfat, low sodium diet divided over 6 small meals Increase water intake to 6-8 glasses

## 2014-06-12 ENCOUNTER — Telehealth: Payer: Self-pay | Admitting: Neurology

## 2014-06-12 NOTE — Telephone Encounter (Signed)
Left message for patient scheduling 1 month hospital follow up with Dr. Roda ShuttersXu.

## 2014-07-19 ENCOUNTER — Ambulatory Visit (INDEPENDENT_AMBULATORY_CARE_PROVIDER_SITE_OTHER): Payer: Self-pay | Admitting: Neurology

## 2014-07-19 ENCOUNTER — Encounter: Payer: Self-pay | Admitting: Neurology

## 2014-07-19 VITALS — BP 105/66 | HR 73 | Ht 60.0 in | Wt 179.0 lb

## 2014-07-19 DIAGNOSIS — G43009 Migraine without aura, not intractable, without status migrainosus: Secondary | ICD-10-CM | POA: Insufficient documentation

## 2014-07-19 DIAGNOSIS — I6340 Cerebral infarction due to embolism of unspecified cerebral artery: Secondary | ICD-10-CM

## 2014-07-19 DIAGNOSIS — Z975 Presence of (intrauterine) contraceptive device: Secondary | ICD-10-CM

## 2014-07-19 MED ORDER — TOPIRAMATE 25 MG PO TABS: ORAL_TABLET | ORAL | Status: DC

## 2014-07-19 NOTE — Patient Instructions (Signed)
-   continue ASA for stroke prevention - will finish off stroke work up with TCD bubble study and some blood test - migraine is a independent risk factor for stroke and will start topamax for migraine prevention - sleep apnea is an independent risk factor for stroke and will do sleep study to rule out - will need OB consult to remove IUD for stroke prevention. - need alternative means for contraception if not plan for pregnancy. - inform us if you plan for pregnancy and we need to adjust your medications. - follow up in 2 months.

## 2014-07-19 NOTE — Progress Notes (Signed)
- no smoking  - migraine  - OSA   STROKE NEUROLOGY FOLLOW UP NOTE  NAME: Kristina [name] DOB: 1986-05-03  REASON FOR VISIT: stroke follow up HISTORY FROM: pt and chart  Today we had the pleasure of seeing Kristina [name] in follow-up at our [facility]. Pt was accompanied by husband.   History Summary 28 yo AAF with no significant PMH on IUD (Mirena, levonorgestrel-releasing intrauterine system) was admitted on 05/09/14 for 2 days hx of headache, left arm numbness, left hand and leg weakness. CT showed acute stroke in right insular cortex. MRI showed right MCA territory stroke consistent with embolic pattern. MRA showed right M2 occlusion. Extensive work up including TEE and TTE and CUS and hypercoagulable work up all negative. Her symptoms resolved and she was discharged with ASA  and recommend IUD removal.  Interval History During the interval time, the patient has been doing well.  She went to health department for IUD removal and was told that no need to remove IUD. She stated that she still has headache, not severe but almost everyday since stroke and like to have some medication for headache. Husband stated that she snores at night but not sure if she stops breathing at night, but pt admit that she feels sleepy and drowsy during the day. she also complains of short term memory loss.  REVIEW OF SYSTEMS: Full 14 system review of systems performed and notable only for those listed below and in HPI above, all others are negative:  Constitutional: N/A  Cardiovascular: N/A  Ear/Nose/Throat: N/A  Skin: N/A  Eyes: N/A  Respiratory: N/A  Gastroitestinal: N/A  Genitourinary: N/A Hematology/Lymphatic: N/A  Endocrine: N/A  Musculoskeletal: muscle cramps, walk difficulty  Allergy/Immunology: N/A  Neurological: memory loss, headache and numbness  Psychiatric: confusion  The following represents the patient's updated allergies and side effects list: No Known  Allergies  Labs since last visit of relevance include the following: Results for orders placed during the hospital encounter of 05/09/14  MRSA PCR SCREENING      Result Value Ref Range   MRSA by PCR NEGATIVE  NEGATIVE  CBC WITH DIFFERENTIAL      Result Value Ref Range   WBC 6.7  4.0 - 10.5 K/uL   RBC 4.96  3.87 - 5.11 MIL/uL   Hemoglobin 14.7  12.0 - 15.0 g/dL   HCT 91.4  78.2 - 95.6 %   MCV 86.9  78.0 - 100.0 fL   MCH 29.6  26.0 - 34.0 pg   MCHC 34.1  30.0 - 36.0 g/dL   RDW 21.3  08.6 - 57.8 %   Platelets 257  150 - 400 K/uL   Neutrophils Relative % 42 (*) 43 - 77 %   Neutro Abs 2.9  1.7 - 7.7 K/uL   Lymphocytes Relative 48 (*) 12 - 46 %   Lymphs Abs 3.2  0.7 - 4.0 K/uL   Monocytes Relative 8  3 - 12 %   Monocytes Absolute 0.5  0.1 - 1.0 K/uL   Eosinophils Relative 2  0 - 5 %   Eosinophils Absolute 0.1  0.0 - 0.7 K/uL   Basophils Relative 0  0 - 1 %   Basophils Absolute 0.0  0.0 - 0.1 K/uL  BASIC METABOLIC PANEL      Result Value Ref Range   Sodium 136 (*) 137 - 147 mEq/L   Potassium 4.1  3.7 - 5.3 mEq/L   Chloride 101  96 - 112 mEq/L  CO2 22  19 - 32 mEq/L   Glucose, Bld 83  70 - 99 mg/dL   BUN 10  6 - 23 mg/dL   Creatinine, Ser 1.61 (*) 0.50 - 1.10 mg/dL   Calcium 8.9  8.4 - 09.6 mg/dL   GFR calc non Af Amer 56 (*) >90 mL/min   GFR calc Af Amer 65 (*) >90 mL/min   Anion gap 13  5 - 15  PREGNANCY, URINE      Result Value Ref Range   Preg Test, Ur NEGATIVE  NEGATIVE  ANTITHROMBIN III      Result Value Ref Range   AntiThromb III Func 97  75 - 120 %  PROTEIN C ACTIVITY      Result Value Ref Range   Protein C Activity 142 (*) 75 - 133 %  PROTEIN C, TOTAL      Result Value Ref Range   Protein C, Total 88  72 - 160 %  PROTEIN S ACTIVITY      Result Value Ref Range   Protein S Activity 81  69 - 129 %  PROTEIN S, TOTAL      Result Value Ref Range   Protein S Ag, Total 97  60 - 150 %  LUPUS ANTICOAGULANT PANEL      Result Value Ref Range   PTT Lupus  Anticoagulant 37.8  28.0 - 43.0 secs   PTTLA Confirmation NOT APPL  <8.0 secs   PTTLA 4:1 Mix NOT APPL  28.0 - 43.0 secs   Drvvt 36.3  <42.9 secs   Drvvt confirmation NOT APPL  <1.15 Ratio   dRVVT Incubated 1:1 Mix NOT APPL  <42.9 secs   Lupus Anticoagulant NOT DETECTED  NOT DETECTED  HOMOCYSTEINE      Result Value Ref Range   Homocysteine 8.8  4.0 - 15.4 umol/L  CARDIOLIPIN ANTIBODIES, IGG, IGM, IGA      Result Value Ref Range   Anticardiolipin IgG 7 (*) <23 GPL U/mL   Anticardiolipin IgM 5 (*) <11 MPL U/mL   Anticardiolipin IgA 5 (*) <22 APL U/mL  ANA      Result Value Ref Range   ANA NEGATIVE  NEGATIVE  HEMOGLOBIN A1C      Result Value Ref Range   Hemoglobin A1C 5.6  <5.7 %   Mean Plasma Glucose 114  <117 mg/dL  LIPID PANEL      Result Value Ref Range   Cholesterol 132  0 - 200 mg/dL   Triglycerides 86  <045 mg/dL   HDL 41  >40 mg/dL   Total CHOL/HDL Ratio 3.2     VLDL 17  0 - 40 mg/dL   LDL Cholesterol 74  0 - 99 mg/dL  HIV ANTIBODY (ROUTINE TESTING)      Result Value Ref Range   HIV 1&2 Ab, 4th Generation NONREACTIVE  NONREACTIVE  RPR      Result Value Ref Range   RPR NON REAC  NON REAC    The neurologically relevant items on the patient's problem list were reviewed on today's visit.  Neurologic Examination  A problem focused neurological exam (12 or more points of the single system neurologic examination, vital signs counts as 1 point, cranial nerves count for 8 points) was performed.  Blood pressure 105/66, pulse 73, height 5' (1.524 m), weight 179 lb (81.194 kg).  General - Well nourished, well developed, in no apparent distress.  Ophthalmologic - Sharp disc margins OU.  Cardiovascular - Regular rate and rhythm  with no murmur.  Mental Status -  Level of arousal and orientation to time, place, and person were intact. Language including expression, naming, repetition, comprehension, reading, and writing was assessed and found intact. Fund of Knowledge was  assessed and was intact.  MOCA  visuospatial - executive 5/5 Naming - 3/3 Memory -  Attention - 2/2, 1/1, 1/3 Language - 1/2, 1/1 Abstraction - 1/2 Delayed recall - 4/5 Orientation - 6/6  Total - 25/30    Cranial Nerves II - XII - II - Visual field intact OU. III, IV, VI - Extraocular movements intact. V - Facial sensation intact bilaterally. VII - Facial movement intact bilaterally. VIII - Hearing & vestibular intact bilaterally. X - Palate elevates symmetrically. XI - Chin turning & shoulder shrug intact bilaterally. XII - Tongue protrusion intact.  Motor Strength - The patient's strength was normal in all extremities and pronator drift was absent.  Bulk was normal and fasciculations were absent.   Motor Tone - Muscle tone was assessed at the neck and appendages and was normal.  Reflexes - The patient's reflexes were normal in all extremities and she had no pathological reflexes.  Sensory - Light touch, temperature/pinprick and Romberg testing were assessed and were normal.    Coordination - The patient had normal movements in the hands and feet with no ataxia or dysmetria.  Tremor was absent.  Gait and Station - The patient's transfers, posture, gait, station, and turns were observed as normal.  Data reviewed: I personally reviewed the images and agree with the radiology interpretations.  CT of the brain 05/09/2014 Findings felt to represent acute infarct the region of the insular cortex on the right at the level of the basal ganglia. No hemorrhage or mass effect. Air-fluid level in right maxillary antrum.  MRI of the brain 05/09/2014 Acute right MCA territory infarct as above, with greatest involvement of the posterior insula.  MRA of the brain 05/09/2014 Occlusion of posterior M2 division branches of the right MCA.  Carotid Doppler No evidence of hemodynamically significant internal carotid artery stenosis. Vertebral artery flow is antegrade.   2D Echocardiogram EF 55-60%  with no source of embolus.  TEE no cardiac emboli source found, no PFO or ASD CXR 05/09/2014 Low lung volumes, otherwise negative.  EKG Sinus arrhythmia.  Component     Latest Ref Rng 05/09/2014 05/10/2014 05/11/2014  PTT Lupus Anticoagulant     28.0 - 43.0 secs 37.8    PTTLA Confirmation     <8.0 secs NOT APPL    PTTLA 4:1 Mix     28.0 - 43.0 secs NOT APPL    DRVVT     <42.9 secs 36.3    Drvvt confirmation     <1.15 Ratio NOT APPL    dRVVT Incubated 1:1 Mix     <42.9 secs NOT APPL    Lupus Anticoagulant     NOT DETECTED NOT DETECTED    Cholesterol     0 - 200 mg/dL  161   Triglycerides     <150 mg/dL  86   HDL     >09 mg/dL  41   Total CHOL/HDL Ratio       3.2   VLDL     0 - 40 mg/dL  17   LDL (calc)     0 - 99 mg/dL  74   Anticardiolipin IgG     <23 GPL U/mL 7 (L)    Anticardiolipin IgM     <11 MPL U/mL 5 (  L)    Anticardiolipin IgA     <22 APL U/mL 5 (L)    Hemoglobin A1C     <5.7 %  5.6   Mean Plasma Glucose     <117 mg/dL  161   AntiThromb III Func     75 - 120 % 97    Protein C Activity     75 - 133 % 142 (H)    Protein C, Total     72 - 160 % 88    Protein S Activity     69 - 129 % 81    Protein S Ag, Total     60 - 150 % 97    Homocysteine     4.0 - 15.4 umol/L 8.8    ANA     NEGATIVE NEGATIVE    HIV     NONREACTIVE   NONREACTIVE  RPR     NON REAC   NON REAC    Assessment: As you may recall, she is a 28 y.o. African American female with PMH of IUD(Mirena, levonorgestrel-releasing intrauterine system) placement admitted on 05/09/14 for right MCA stroke. MRA showed right M2 occlusion. So far stroke work up including CUS, TTE and TEE were all negative. Hypercoagulable work up so far negative. Will do TCD with bubble study to rule out PFO and autoimmune work up to rule out vasculitis. Etiology unclear but risk factor could be due to estrogen IUD use, migraine HA and possible OSA. Will add topamax for HA prevention and do sleep study.   I had long discussion  with pt and her husband that IUD should be removed due to stroke hx so I will have OB referral to consider IUD removal. If that is removed, they should have alternative means for contraception. I have prescribed topamax for HA prevention, if pt plans for pregnancy, she needs to let us know and we may remove topamax from her med list in preparation of pregnancy. They expressed understanding.  Plan:  - continue ASA for stroke prevention - stroke work up with TCD bubble study and autoimmune work up - migraine is a independent risk factor for stroke and will start topamax for migraine prevention - sleep apnea is an independent risk factor for stroke and will do sleep study to rule out OSA - OB consult to remove IUD for stroke prevention. - need alternative means for contraception if not plan for pregnancy. - inform us if you plan for pregnancy and we may remove topamax from your med list. - RTC in 2 months.  Orders Placed This Encounter  Procedures  . [place] TRANSCRANIAL DOPPLER WITH BUBBLES    Standing Status: Future     Number of Occurrences:      Standing Expiration Date: 01/17/2015    Order Specific Question:  Reason for Exam (SYMPTOM  OR DIAGNOSIS REQUIRED)    Answer:  rule out PFO    Order Specific Question:  Preferred imaging location?    Answer:  [facility]  . Sickle cell screen  . Systemic Lupus Profile A  . Antineutrophil Cytoplasmic Ab  . Ambulatory referral to Obstetrics / Gynecology    Referral Priority:  Routine    Referral Type:  Consultation    Referral Reason:  Specialty Services Required    Requested Specialty:  Obstetrics and Gynecology    Number of Visits Requested:  1  . Nocturnal polysomnography    Standing Status: Future     Number of Occurrences:  Standing Expiration Date: 07/20/2015    Order Specific Question:  Where should this test be performed:    Answer:  [facility]    Patient Instructions  - continue ASA for stroke prevention - will  finish off stroke work up with TCD bubble study and some blood test - migraine is a independent risk factor for stroke and will start topamax for migraine prevention - sleep apnea is an independent risk factor for stroke and will do sleep study to rule out - will need OB consult to remove IUD for stroke prevention. - need alternative means for contraception if not plan for pregnancy. - inform us if you plan for pregnancy and we need to adjust your medications. - follow up in 2 months.   Marvel Plan, MD PhD [facility] [address] [place], [place] [address] [phone]

## 2014-07-20 LAB — SYSTEMIC LUPUS PROFILE A
Chromatin Ab SerPl-aCnc: <0.2 AI (ref 0.0–0.9)
ENA RNP Ab: 0.2 AI (ref 0.0–0.9)
ENA SSA (RO) Ab: <0.2 AI (ref 0.0–0.9)
ENA SSB (LA) Ab: <0.2 AI (ref 0.0–0.9)
RHEUMATOID FACTOR: 7.4 [IU]/mL (ref 0.0–13.9)
dsDNA Ab: 2 IU/mL (ref 0–9)

## 2014-07-20 LAB — ANTINEUTROPHIL CYTOPLASMIC AB
C-ANCA: <1:20 {titer}
P-ANCA: <1:20 {titer}

## 2014-07-20 LAB — SICKLE CELL SCREEN: Sickle Cell Prep: NEGATIVE

## 2014-07-23 NOTE — Progress Notes (Signed)
Quick Note:  Pt called and LMVM for her that labs negative. She is signed up for mychart. Will view results when able. ______

## 2014-07-30 ENCOUNTER — Telehealth: Payer: Self-pay | Admitting: Neurology

## 2014-07-30 DIAGNOSIS — R0683 Snoring: Secondary | ICD-10-CM

## 2014-07-30 DIAGNOSIS — G4730 Sleep apnea, unspecified: Secondary | ICD-10-CM

## 2014-07-30 DIAGNOSIS — I639 Cerebral infarction, unspecified: Secondary | ICD-10-CM

## 2014-07-30 DIAGNOSIS — E669 Obesity, unspecified: Secondary | ICD-10-CM

## 2014-07-30 DIAGNOSIS — G4710 Hypersomnia, unspecified: Secondary | ICD-10-CM

## 2014-07-30 NOTE — Telephone Encounter (Signed)
Dr. Roda Shutters, refers patient for attended sleep study.  Height: 5  Weight: 179 lb.  BMI: 34.96  Past Medical History:  28 yo AAF with no significant PMH on IUD (Mirena, levonorgestrel-releasing intrauterine system) was admitted on 05/09/14 for 2 days hx of headache, left arm numbness, left hand and leg weakness. CT showed acute stroke in right insular cortex. MRI showed right MCA territory stroke consistent with embolic pattern. MRA showed right M2 occlusion. Extensive work up including TEE and TTE and CUS and hypercoagulable work up all negative. Her symptoms resolved and she was discharged with ASA  and recommend IUD removal.   Sleep Symptoms: Husband stated that she snores at night but not sure if she stops breathing at night, but pt admit that she feels sleepy and drowsy during the day.    Medications: Aspirin (Tab) aspirin 325 MG Take 1 tablet (325 mg total) by mouth daily. B Complex-C (Tab) B-complex with vitamin C Take 1 tablet by mouth daily. Topiramate (Tab) TOPAMAX 25 MG  daily at night for a week and then  twice a day for a week and then  twice a day after     Insurance: [employer]  Dr. Warren Danes Assessment & Plan:As you may recall, she is a 28 y.o. African American female with PMH of IUD(Mirena, levonorgestrel-releasing intrauterine system) placement admitted on 05/09/14 for right MCA stroke. MRA showed right M2 occlusion. So far stroke work up including CUS, TTE and TEE were all negative. Hypercoagulable work up so far negative. Will do TCD with bubble study to rule out PFO and autoimmune work up to rule out vasculitis. Etiology unclear but risk factor could be due to estrogen IUD use, migraine HA and possible OSA. Will add topamax for HA prevention and do sleep study.  I had long discussion with pt and her husband that IUD should be removed due to stroke hx so I will have OB referral to consider IUD removal. If that is removed, they should have alternative means for contraception. I  have prescribed topamax for HA prevention, if pt plans for pregnancy, she needs to let us know and we may remove topamax from her med list in preparation of pregnancy. They expressed understanding.  Plan:  - continue ASA for stroke prevention  - stroke work up with TCD bubble study and autoimmune work up  - migraine is a independent risk factor for stroke and will start topamax for migraine prevention  - sleep apnea is an independent risk factor for stroke and will do sleep study to rule out OSA  - OB consult to remove IUD for stroke prevention.  - need alternative means for contraception if not plan for pregnancy.  - inform us if you plan for pregnancy and we may remove topamax from your med list.  - RTC in 2 months.       Please review patient information and submit instructions for scheduling and orders for sleep technologist.  Thank you!

## 2014-07-31 NOTE — Telephone Encounter (Signed)
Obese, stroke, embolism,  Snoring. Eporth over 10 , FSS elevated.   Order SPLIT at [facility] 10 with 3 % score for commercial patient .

## 2014-08-14 ENCOUNTER — Telehealth: Payer: Self-pay | Admitting: Radiology

## 2014-09-27 ENCOUNTER — Encounter: Payer: Self-pay | Admitting: Neurology

## 2014-09-27 ENCOUNTER — Ambulatory Visit (INDEPENDENT_AMBULATORY_CARE_PROVIDER_SITE_OTHER): Payer: Self-pay | Admitting: Neurology

## 2014-09-27 VITALS — BP 122/87 | HR 84 | Ht 60.0 in | Wt 171.8 lb

## 2014-09-27 DIAGNOSIS — G43009 Migraine without aura, not intractable, without status migrainosus: Secondary | ICD-10-CM

## 2014-09-27 DIAGNOSIS — I6340 Cerebral infarction due to embolism of unspecified cerebral artery: Secondary | ICD-10-CM

## 2014-09-27 DIAGNOSIS — Z975 Presence of (intrauterine) contraceptive device: Secondary | ICD-10-CM

## 2014-09-27 DIAGNOSIS — I63411 Cerebral infarction due to embolism of right middle cerebral artery: Secondary | ICD-10-CM

## 2014-09-27 NOTE — Patient Instructions (Signed)
-   continue ASA for now - wait for the insurance to see if we can have further work up  - discuss with PCP regarding removal of IUD - Follow up with your primary care physician for stroke risk factor modification. Recommend maintain blood pressure goal <130/80, diabetes with hemoglobin A1c goal below 6.5% and lipids with LDL cholesterol goal below 70 mg/dL.  - follow up in 2 months.

## 2014-09-30 DIAGNOSIS — I63411 Cerebral infarction due to embolism of right middle cerebral artery: Secondary | ICD-10-CM | POA: Insufficient documentation

## 2014-09-30 NOTE — Progress Notes (Signed)
- no smoking  - migraine  - OSA   STROKE NEUROLOGY FOLLOW UP NOTE  NAME: Kristina [name] DOB: August 08, 1986  REASON FOR VISIT: stroke follow up HISTORY FROM: pt and chart  Today we had the pleasure of seeing Kristina [name] in follow-up at our Neurology [facility]. Pt was accompanied by husband.   History Summary 28 yo AAF with no significant PMH on IUD (Mirena, levonorgestrel-releasing intrauterine system) was admitted on 05/09/14 for 2 days hx of headache, left arm numbness, left hand and leg weakness. CT showed acute stroke in right insular cortex. MRI showed right MCA territory stroke consistent with embolic pattern. MRA showed right M2 occlusion. Extensive work up including TEE and TTE and CUS and hypercoagulable work up all negative. Her symptoms resolved and she was discharged with ASA 325mg  and recommend IUD removal.  Follow up 07/19/14 - the patient has been doing well.  She went to health department for IUD removal and was told that no need to remove IUD. She stated that she still has headache, not severe but almost everyday since stroke and like to have some medication for headache. Husband stated that she snores at night but not sure if she stops breathing at night, but pt admit that she feels sleepy and drowsy during the day. she also complains of short term memory loss.  Interval History During the interval time, she was doing the same. She lost her insurance and waiting for next week for decision on medcaid. She has not had IUD removed and she is not taking meds except OTC ASA. Not able to do the TCD or sleep study. She has no complains.  REVIEW OF SYSTEMS: Full 14 system review of systems performed and notable only for those listed below and in HPI above, all others are negative:  Constitutional: activity change  Cardiovascular: N/A  Ear/Nose/Throat: N/A  Skin: N/A  Eyes: light sensitivity, blurred vision  Respiratory: N/A  Gastroitestinal: N/A  Genitourinary: urinary  frequency Hematology/Lymphatic: N/A  Endocrine: cold intolerance, excessive thirst  Musculoskeletal: muscle cramps, walk difficulty  Allergy/Immunology: N/A  Neurological: memory loss, headache and numbness, restless leg, dizziness, weakness, tremors  Psychiatric: confusion  The following represents the patient's updated allergies and side effects list: No Known Allergies  Labs since last visit of relevance include the following: Results for orders placed or performed in visit on 07/19/14  Sickle cell screen  Result Value Ref Range   Sickle Cell Prep Negative Negative  Systemic Lupus Profile A  Result Value Ref Range   ENA RNP Ab 0.2 0.0 - 0.9 AI   ENA SM Ab Ser-aCnc <0.2 0.0 - 0.9 AI   Rhuematoid fact SerPl-aCnc 7.4 0.0 - 13.9 IU/mL   Chromatin Ab SerPl-aCnc <0.2 0.0 - 0.9 AI   ENA SSA (RO) Ab <0.2 0.0 - 0.9 AI   ENA SSB (LA) Ab <0.2 0.0 - 0.9 AI   dsDNA Ab 2 0 - 9 IU/mL  Antineutrophil Cytoplasmic Ab  Result Value Ref Range   C-ANCA <1:20 Neg:<1:20 titer   P-ANCA <1:20 Neg:<1:20 titer   Atypical pANCA <1:20 Neg:<1:20 titer    The neurologically relevant items on the patient's problem list were reviewed on today's visit.  Neurologic Examination  A problem focused neurological exam (12 or more points of the single system neurologic examination, vital signs counts as 1 point, cranial nerves count for 8 points) was performed.  Blood pressure 122/87, pulse 84, height 5' (1.524 m), weight 171 lb 12.8 oz (77.928 kg).  General - Well nourished, well developed, in no apparent distress.  Ophthalmologic - Sharp disc margins OU.  Cardiovascular - Regular rate and rhythm with no murmur.  Mental Status -  Level of arousal and orientation to time, place, and person were intact. Language including expression, naming, repetition, comprehension, reading, and writing was assessed and found intact. Fund of Knowledge was assessed and was intact.  MOCA visuospatial - executive  5/5 Naming - 3/3 Memory -  Attention - 2/2, 1/1, 1/3 Language - 1/2, 1/1 Abstraction - 1/2 Delayed recall - 4/5 Orientation - 6/6 Total - 25/30   MMSE 27/30  Cranial Nerves II - XII - II - Visual field intact OU. III, IV, VI - Extraocular movements intact. V - Facial sensation intact bilaterally. VII - Facial movement intact bilaterally. VIII - Hearing & vestibular intact bilaterally. X - Palate elevates symmetrically. XI - Chin turning & shoulder shrug intact bilaterally. XII - Tongue protrusion intact.  Motor Strength - The patient's strength was normal in all extremities and pronator drift was absent.  Bulk was normal and fasciculations were absent.   Motor Tone - Muscle tone was assessed at the neck and appendages and was normal.  Reflexes - The patient's reflexes were normal in all extremities and she had no pathological reflexes.  Sensory - Light touch, temperature/pinprick and Romberg testing were assessed and were normal.    Coordination - The patient had normal movements in the hands and feet with no ataxia or dysmetria.  Tremor was absent.  Gait and Station - The patient's transfers, posture, gait, station, and turns were observed as normal.  Data reviewed: I personally reviewed the images and agree with the radiology interpretations.  CT of the brain 05/09/2014 Findings felt to represent acute infarct the region of the insular cortex on the right at the level of the basal ganglia. No hemorrhage or mass effect. Air-fluid level in right maxillary antrum.  MRI of the brain 05/09/2014 Acute right MCA territory infarct as above, with greatest involvement of the posterior insula.  MRA of the brain 05/09/2014 Occlusion of posterior M2 division branches of the right MCA.  Carotid Doppler No evidence of hemodynamically significant internal carotid artery stenosis. Vertebral artery flow is antegrade.   2D Echocardiogram EF 55-60% with no source of embolus.  TEE no cardiac emboli  source found, no PFO or ASD CXR 05/09/2014 Low lung volumes, otherwise negative.  EKG Sinus arrhythmia.  Component     Latest Ref Rng 05/09/2014 05/10/2014 05/11/2014  PTT Lupus Anticoagulant     28.0 - 43.0 secs 37.8    PTTLA Confirmation     <8.0 secs NOT APPL    PTTLA 4:1 Mix     28.0 - 43.0 secs NOT APPL    DRVVT     <42.9 secs 36.3    Drvvt confirmation     <1.15 Ratio NOT APPL    dRVVT Incubated 1:1 Mix     <42.9 secs NOT APPL    Lupus Anticoagulant     NOT DETECTED NOT DETECTED    Cholesterol     0 - 200 mg/dL  161132   Triglycerides     <150 mg/dL  86   HDL     >09>39 mg/dL  41   Total CHOL/HDL Ratio       3.2   VLDL     0 - 40 mg/dL  17   LDL (calc)     0 - 99 mg/dL  74  Anticardiolipin IgG     <23 GPL U/mL 7 (L)    Anticardiolipin IgM     <11 MPL U/mL 5 (L)    Anticardiolipin IgA     <22 APL U/mL 5 (L)    Hemoglobin A1C     <5.7 %  5.6   Mean Plasma Glucose     <117 mg/dL  604   AntiThromb III Func     75 - 120 % 97    Protein C Activity     75 - 133 % 142 (H)    Protein C, Total     72 - 160 % 88    Protein S Activity     69 - 129 % 81    Protein S Ag, Total     60 - 150 % 97    Homocysteine     4.0 - 15.4 umol/L 8.8    ANA     NEGATIVE NEGATIVE    HIV     NONREACTIVE   NONREACTIVE  RPR     NON REAC   NON REAC    Assessment: As you may recall, she is a 28 y.o. African American female with PMH of IUD(Mirena, levonorgestrel-releasing intrauterine system) placement admitted on 05/09/14 for right MCA stroke. MRA showed right M2 occlusion. So far stroke work up including CUS, TTE and TEE were all negative. Hypercoagulable work up so far negative. Will do TCD with bubble study to rule out PFO and autoimmune work up to rule out vasculitis. Etiology unclear but risk factor could be due to estrogen IUD use, migraine HA and possible OSA. Will add topamax for HA prevention and do sleep study.   I had long discussion with pt and her husband that IUD should be removed  due to stroke hx so I will have OB referral to consider IUD removal. If that is removed, they should have alternative means for contraception. I have prescribed topamax for HA prevention, if pt plans for pregnancy, she needs to let us know and we may remove topamax from her med list in preparation of pregnancy. They expressed understanding.  Plan:  - continue ASA for stroke prevention for now - wait for insurance to see if we can continue further work up - migraine is a independent risk factor for stroke and will hold off topamax for migraine prevention for now due to insurance - sleep apnea is an independent risk factor for stroke and will hold off sleep study to rule out OSA for now due to insurance - will contact Dr. Molli Hazard for [facility] consult to remove IUD for stroke prevention. - need alternative means for contraception if not plan for pregnancy after IUD removal. - pt may be RESPECT - ESUS candidate, will contact the research coordinator to see if pt qualifies for the study. Pt seems interested in it.  - RTC in 2 months.  No orders of the defined types were placed in this encounter.   Patient Instructions  - continue ASA for now - wait for the insurance to see if we can have further work up  - discuss with PCP regarding removal of IUD - Follow up with your primary care physician for stroke risk factor modification. Recommend maintain blood pressure goal <130/80, diabetes with hemoglobin A1c goal below 6.5% and lipids with LDL cholesterol goal below 70 mg/dL.  - follow up in 2 months.    Marvel Plan, MD PhD [facility] [address], [address] [place], [place] [address] [phone]   

## 2014-10-12 ENCOUNTER — Telehealth: Payer: Self-pay

## 2014-10-12 NOTE — Telephone Encounter (Signed)
I spoke with Lorne SkeensAnna [facility] she spoke with patient and an ob/gyn at their office and patient wants to keep iud. Tobi Bastosnna says she will contact dr. Roda ShuttersXu (neurology) to advised them on the benefits of her iud being the Mirena and will ask them if they will approve her keeping the iud in at this time. Thanks!

## 2014-10-15 ENCOUNTER — Telehealth: Payer: Self-pay | Admitting: Neurology

## 2014-10-15 NOTE — Telephone Encounter (Signed)
[facility]ristina [name] with [facility] @ [phone], stated patient informed Dr. Adrian BlackwaterStinson, Gynecologist that Dr. Roda ShuttersXu wanted IUD removed.  Dr. Adrian BlackwaterStinson feels that removing IUD would not be beneficial to patient.  Please call and discuss further @ [phone].

## 2014-10-15 NOTE — Telephone Encounter (Signed)
Spoke with Dr Adrian BlackwaterStinson, he states that he would like to know why patient's Mirena IUD should be removed, states that it is minimal risk for stroke, you can reach Dr Adrian BlackwaterStinson at [phone] and speak with any attending on call physician.

## 2014-10-22 NOTE — Telephone Encounter (Signed)
Discussed with Dr. Adrian BlackwaterStinson over the phone. Pt is using the mirena IUD for contraception which is progesterone only IUD and carries less risk of cardiovascular side effect as compared with estrogen containing IUD. Dr. Adrian BlackwaterStinson thinks it is acceptable for her to continue using it.   Although stroke is still listed as a condition that may need clinical consideration for use of mirena (see below) and she may have many other hormon-free means for contraception, I am OK with her to continue mirena if pt is willing to accept the small risk, if any.    I talked with pt over the phone and discussed with her the options and pros and cons of each option. I told her that it is her decision to stay with mirena IUD and accept the small risk or to switch to hormone-free means of contraception. She expressed understanding.  Marvel PlanJindong Berwyn Bigley, MD PhD Stroke Neurology 10/22/2014 6:05 PM   Clinical considerations for use and removal of Mirena  Use Mirena with caution after careful assessment in patients with coagulopathy or taking anticoagulants; migraine, focal migraine with asymmetrical visual loss, or other symptoms indicating transient cerebral ischemia; exceptionally severe headache; marked increase of blood pressure; or severe arterial disease such as stroke or myocardial infarction. Consider removing the intrauterine system if these or the following arise during use: uterine or cervical malignancy or jaundice. If Mirena is displaced (e.g., expelled or perforated the uterus), remove it.

## 2014-12-05 ENCOUNTER — Ambulatory Visit: Payer: Self-pay | Admitting: Neurology

## 2014-12-27 ENCOUNTER — Telehealth: Payer: Self-pay | Admitting: Radiology

## 2014-12-27 NOTE — Telephone Encounter (Signed)
Called and left message for patient to call to schedule the Bubble study.  I have received no call backs from prior messages.

## 2016-04-13 IMAGING — CT CT HEAD W/O CM
2 series · 15 of 30 positions shown, 19 images · non-contrast
Comparison: None.

CLINICAL DATA: Headache and dizziness ; left-sided numbness

EXAM:
CT HEAD WITHOUT CONTRAST
TECHNIQUE: Contiguous axial images were obtained from the base of the skull
through the vertex without intravenous contrast.

[Series 2: head w/o · axial · non-contrast · 0.43mm/px · z∈[-157,-37]mm · 13 of 28 slices shown, 17 images]
[im 2/28  brain]
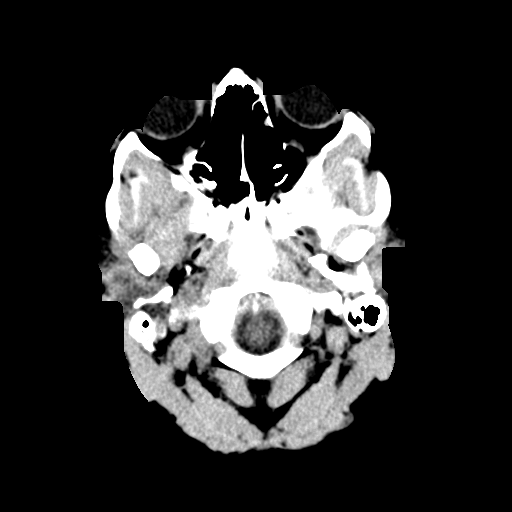
[im 2/28  bone]
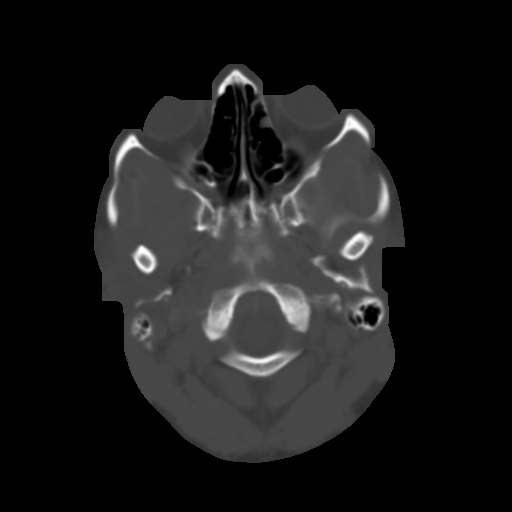
[im 4/28  brain]
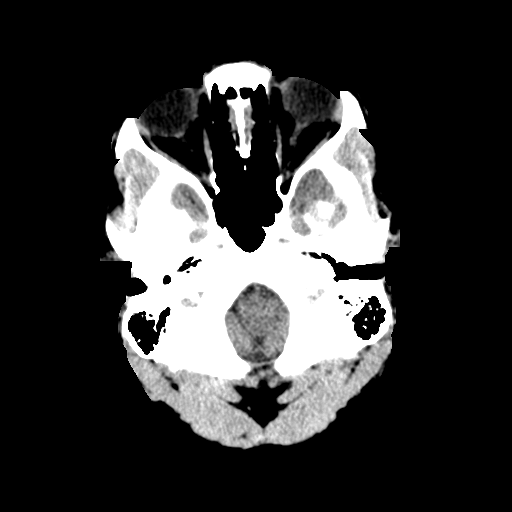
[im 6/28  brain]
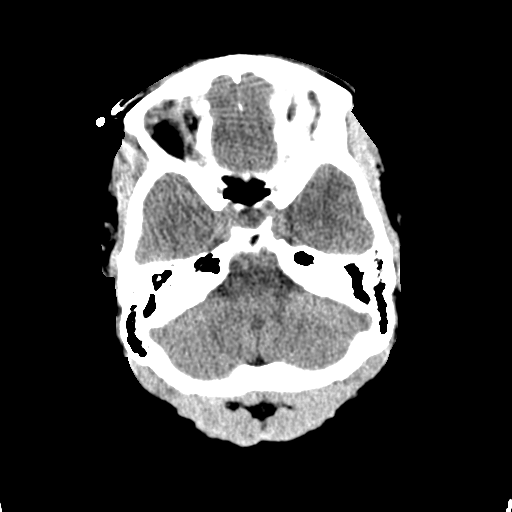
[im 8/28  brain]
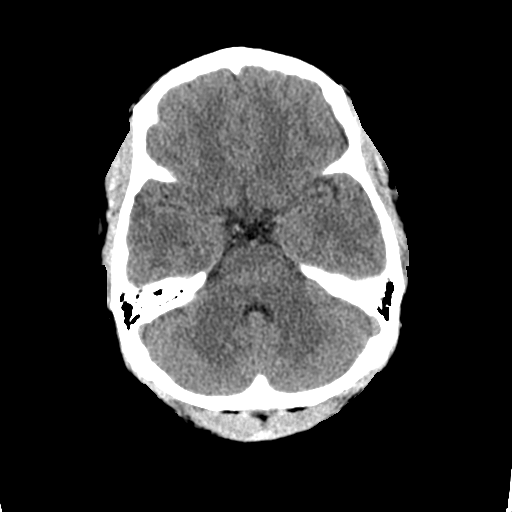
[im 10/28  brain]
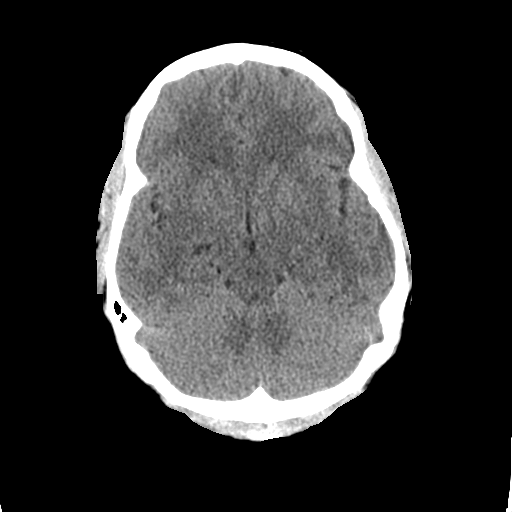
[im 10/28  bone]
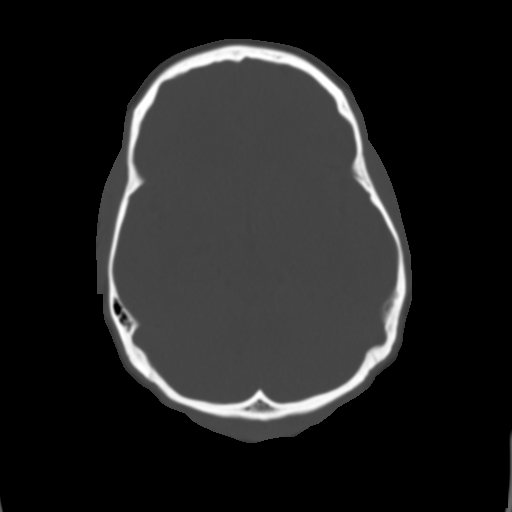
[im 12/28  brain]
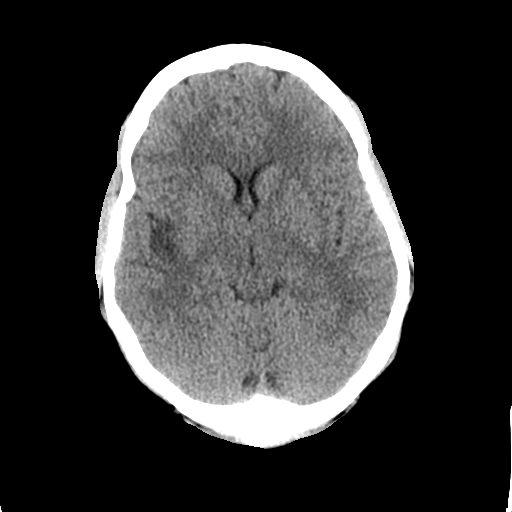
[im 14/28  brain]
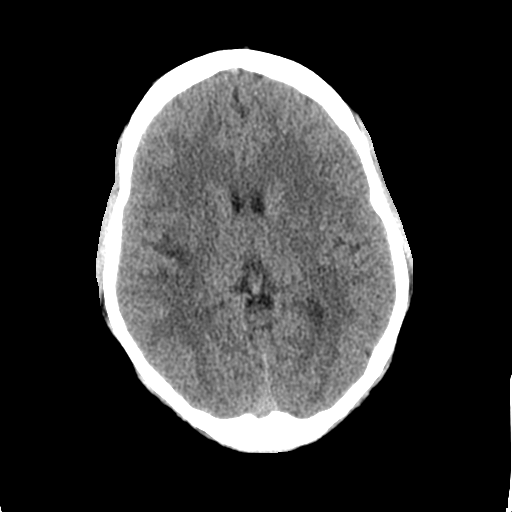
[im 16/28  brain]
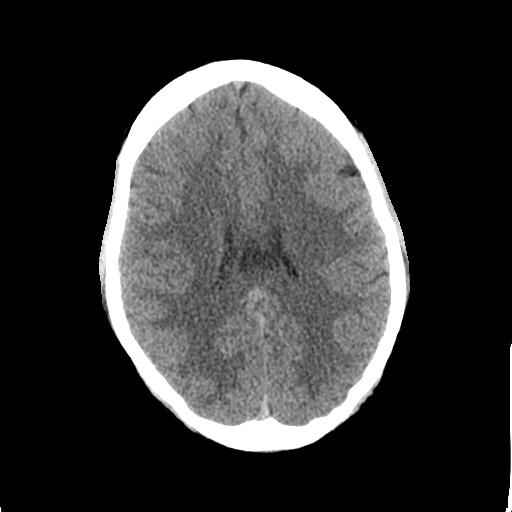
[im 18/28  brain]
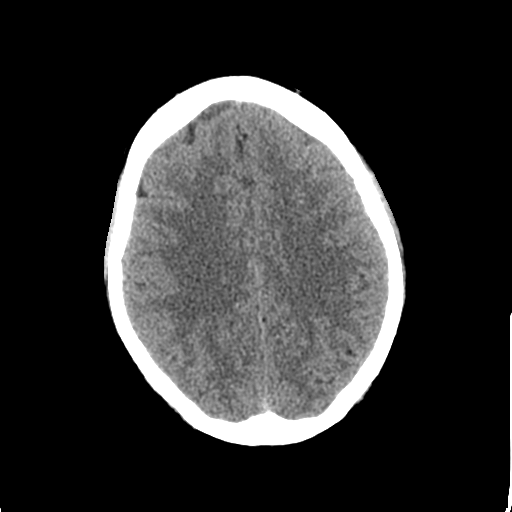
[im 18/28  bone]
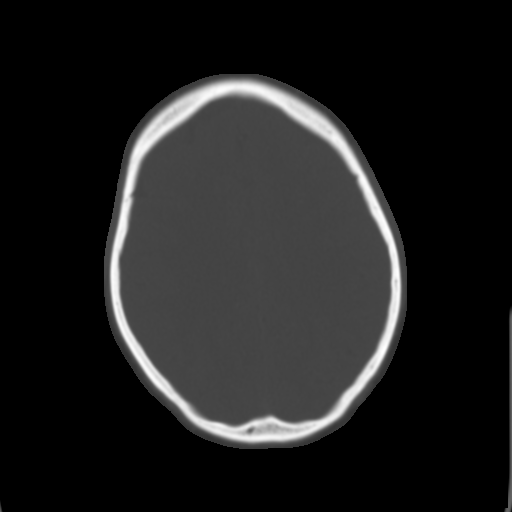
[im 20/28  brain]
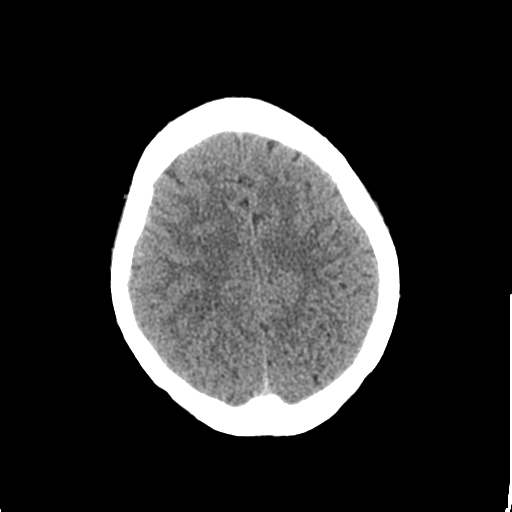
[im 22/28  brain]
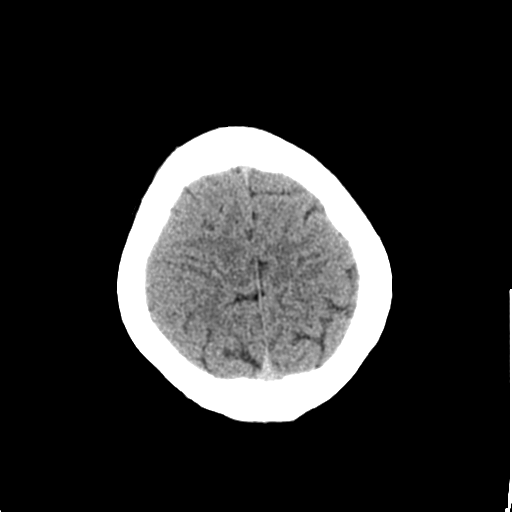
[im 24/28  brain]
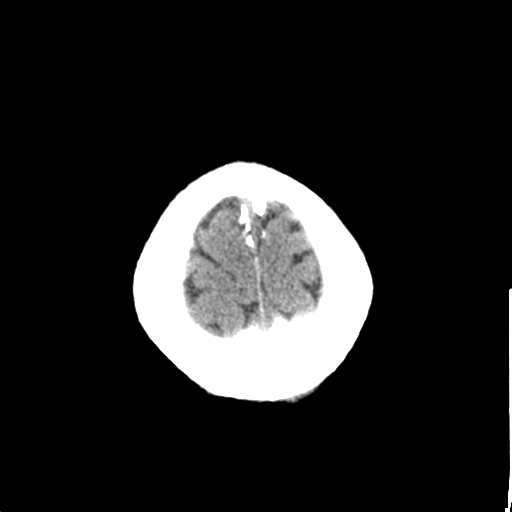
[im 26/28  brain]
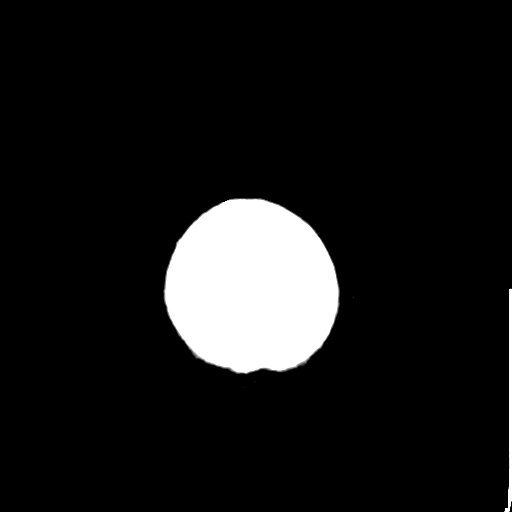
[im 26/28  bone]
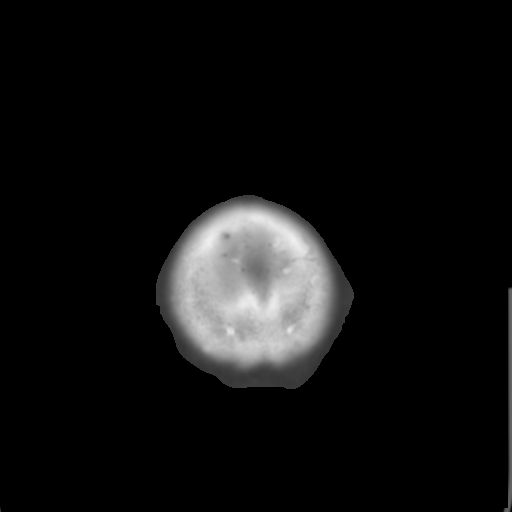

[Series 3: bone windows · axial · 0.43mm/px · z∈[-157,-137]mm · 2 of 28 slices shown]
[im 2/28  bone]
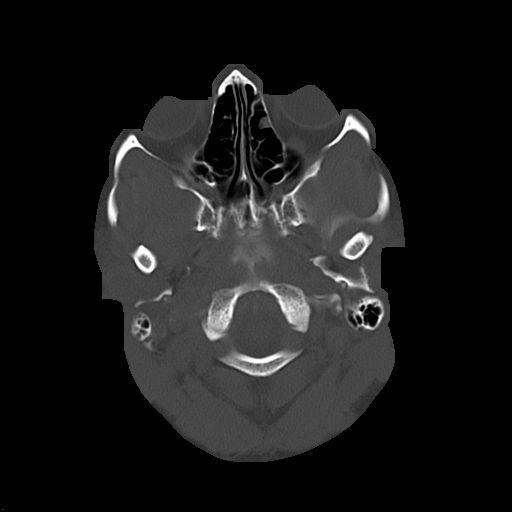
[im 6/28  bone]
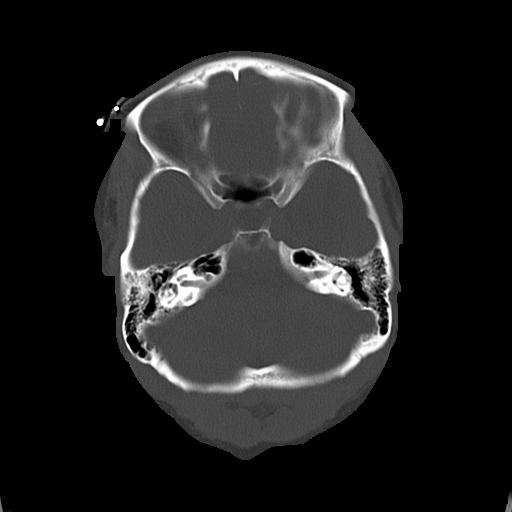

[15 of 30 positions shown; findings below may reference images not displayed]

FINDINGS: The ventricles are normal in size and configuration. There is no
appreciable mass, hemorrhage, extra-axial fluid collection, or
midline shift.

There is decreased attenuation in the region of the insular cortex
on the right at the level of the right basal ganglia. This finding
is felt to represent an acute infarct in this region. It is best
seen on axial slices 11, 12, 13, and 14. Elsewhere gray-white
compartments appear normal.

Bony calvarium appears intact. The mastoid air cells are clear.
There is an air-fluid level in the superior right maxillary antrum.
IMPRESSION: Findings felt to represent acute infarct the region of the insular
cortex on the right at the level of the basal ganglia. No hemorrhage
or mass effect. Air-fluid level in right maxillary antrum.

## 2016-04-13 IMAGING — MR MR MRA HEAD W/O CM
9 of 11 series · 26 of 48 positions shown · non-contrast
Comparison: Head CT 05/09/2014

CLINICAL DATA: Stroke. Dizziness and left hand and left leg
weakness.

EXAM:
MRI HEAD WITHOUT CONTRAST
MRA HEAD WITHOUT CONTRAST
TECHNIQUE: Multiplanar, multiecho pulse sequences of the brain and surrounding
structures were obtained without intravenous contrast. Angiographic
images of the head were obtained using MRA technique without
contrast.

[Series 3: DWI · axial · 5.0mm · 1.02mm/px · z∈[-88,+38]mm · 4 of 54 slices shown (1 of 4)]
[im 1/54]
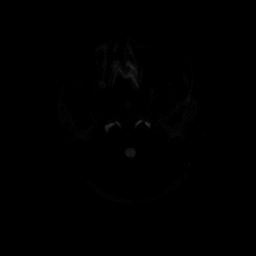
[im 18/54]
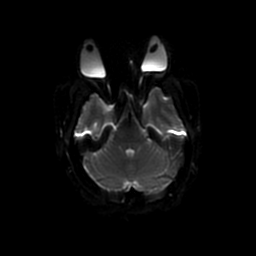
[im 36/54]
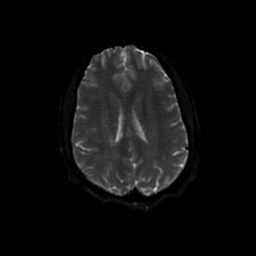
[im 54/54]
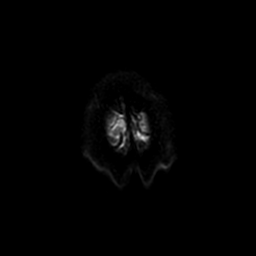

[Series 4: ax (id) 2 · axial · 1.4mm · 0.43mm/px · z∈[-100,-9]mm · 7 of 152 slices shown]
[im 1/152]
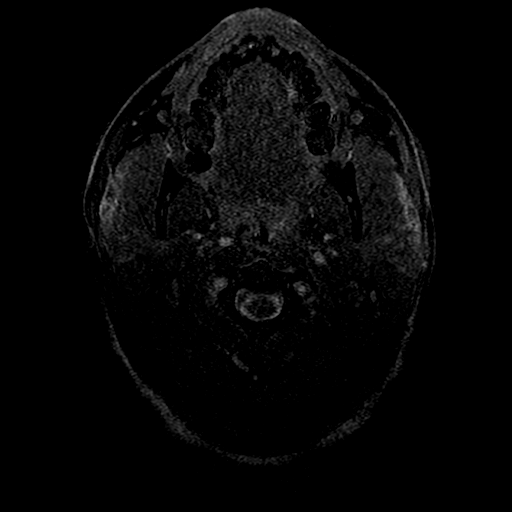
[im 17/152]
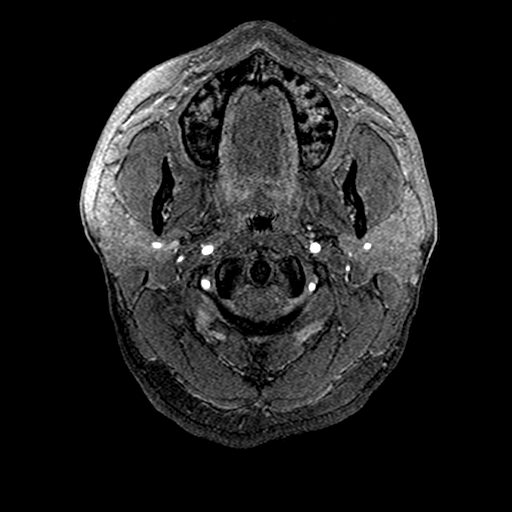
[im 51/152]
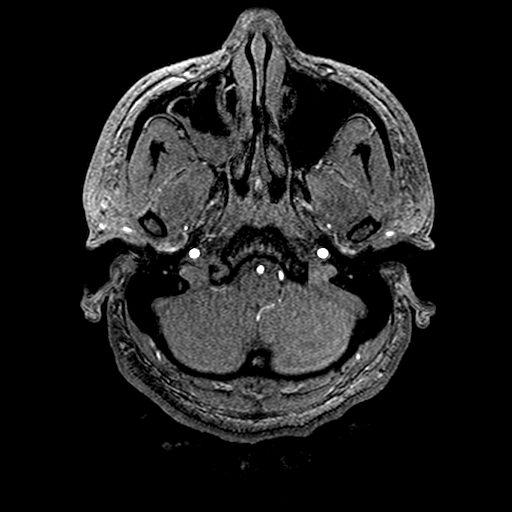
[im 68/152]
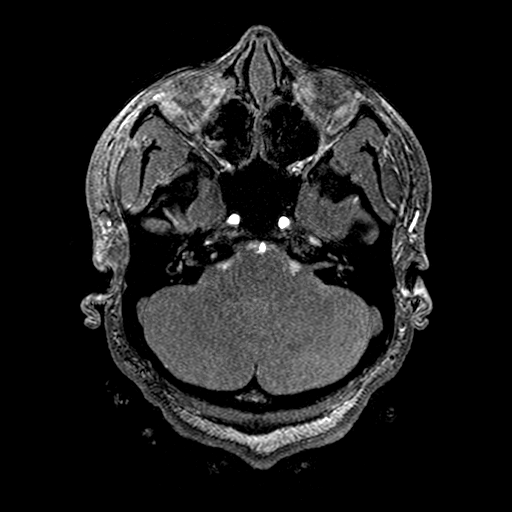
[im 84/152]
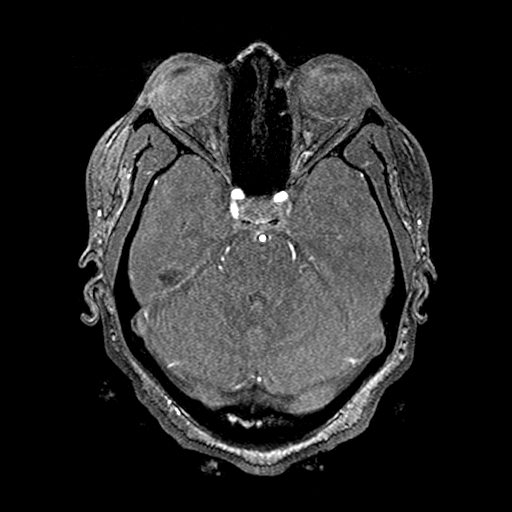
[im 101/152]
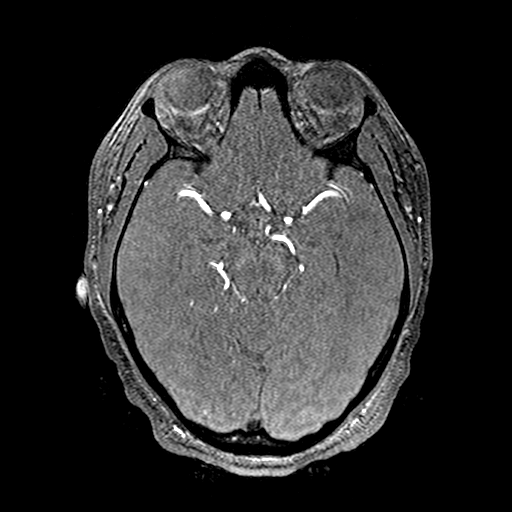
[im 135/152]
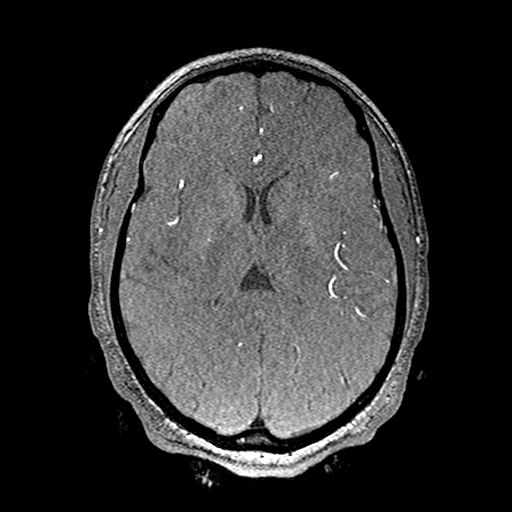

[Series 5: FLAIR · sagittal · 5.0mm · 0.47mm/px · 1 of 21 slices shown (1 of 2)]
[im 1/21]
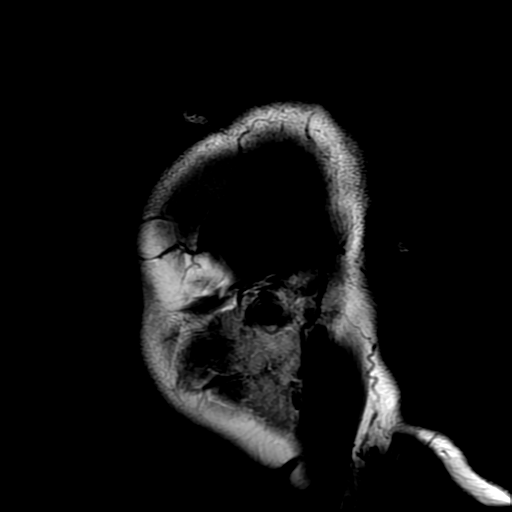

[Series 6: FLAIR · axial · 5.0mm · 0.43mm/px · z∈[-84,+44]mm · 2 of 23 slices shown (2 of 2)]
[im 1/23]
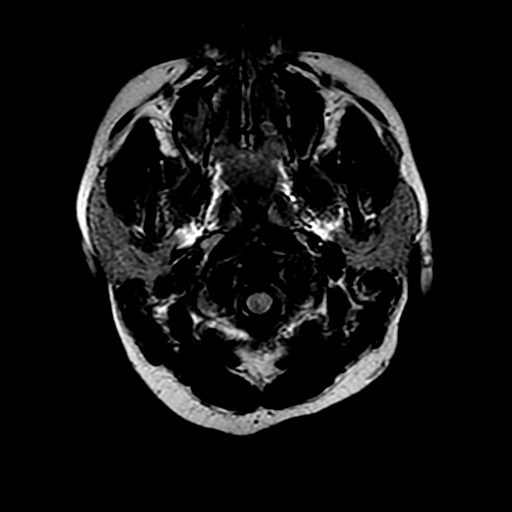
[im 23/23]
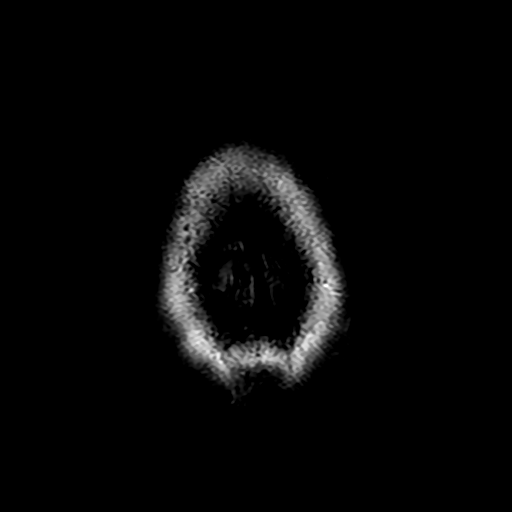

[Series 7: T2 · axial · 5.0mm · 0.43mm/px · z∈[-84,+44]mm · 2 of 23 slices shown (1 of 2)]
[im 1/23]
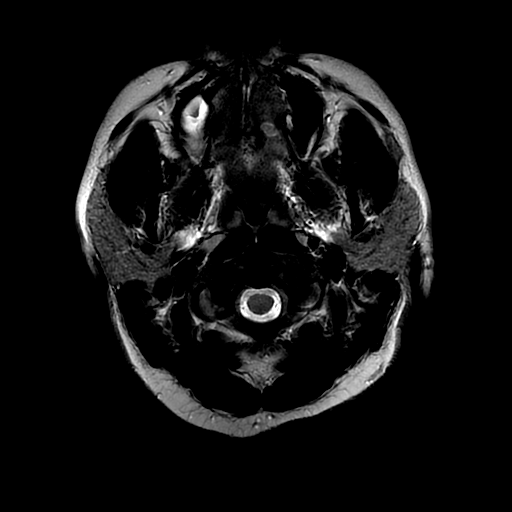
[im 23/23]
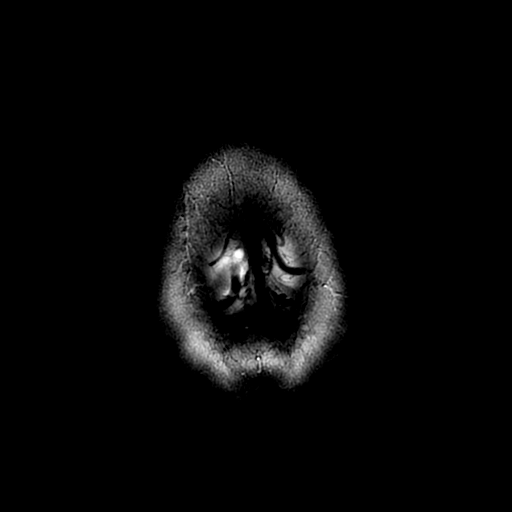

[Series 8: DWI · coronal · 5.0mm · 1.02mm/px · 4 of 62 slices shown (2 of 4)]
[im 1/62]
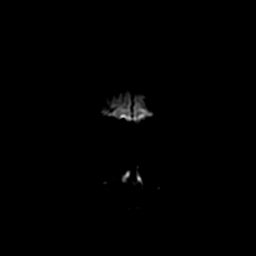
[im 21/62]
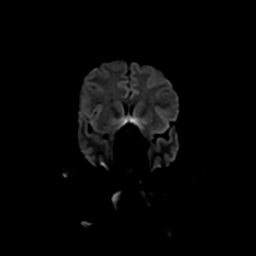
[im 41/62]
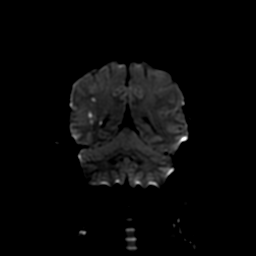
[im 62/62]
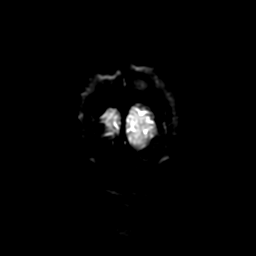

[Series 11: T2 · coronal · 5.0mm · 0.47mm/px · 2 of 26 slices shown (2 of 2)]
[im 1/26]
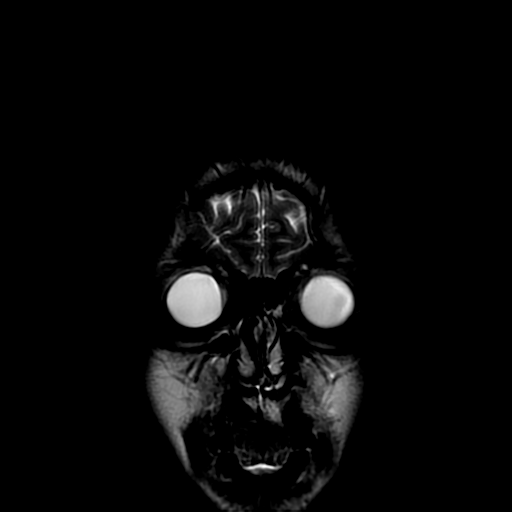
[im 26/26]
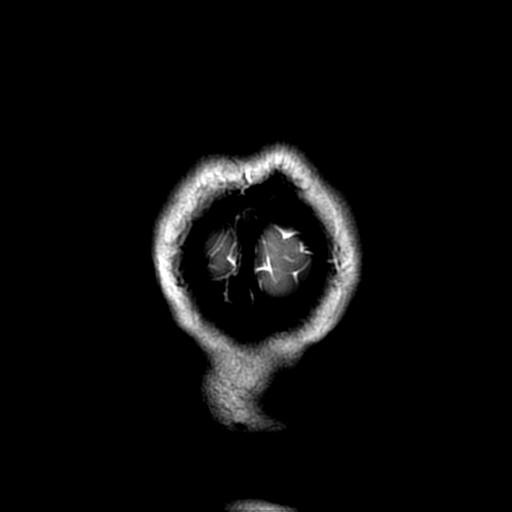

[Series 300: DWI · axial · 5.0mm · 1.02mm/px · z∈[-88,+38]mm · 2 of 27 slices shown (3 of 4)]
[im 1/27]
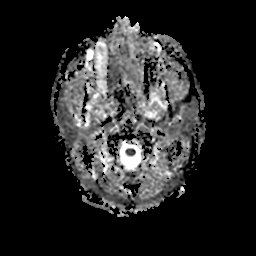
[im 27/27]
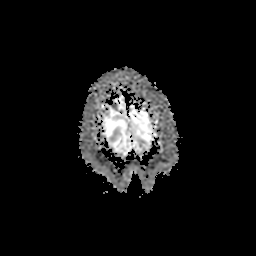

[Series 800: DWI · coronal · 5.0mm · 1.02mm/px · 2 of 31 slices shown (4 of 4)]
[im 1/31]
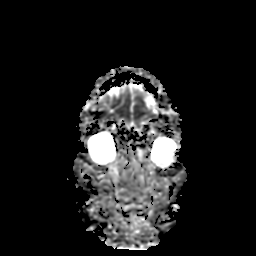
[im 31/31]
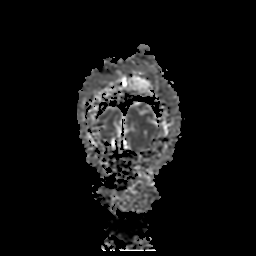

[26 of 48 positions shown; findings below may reference images not displayed]

FINDINGS: MRI HEAD FINDINGS

Slight cerebellar tonsillar ectopia is noted, measuring 4 mm just
left of midline. There is an acute infarct measuring approximately 3
x 1.5 x 2.5 cm involving the right posterior insula, subinsular
white matter tracts, and a small portion of the putamen. Small foci
of acute infarction are also present more superiorly and posteriorly
in the right hemisphere involving posterior temporal lobe cortex,
optic radiation, posterior aspects of the corona radiata and centrum
semiovale, and subcortical parietal white matter. There is no
evidence of intracranial hemorrhage. There is mild edema associated
with these areas of infarct without significant mass effect. There
is no mass, midline shift or extra-axial fluid collection.
Ventricles and sulci are within normal limits. Incidental note is
made of a cavum velum interpositum.

Orbits are unremarkable. There is moderate right maxillary sinus
mucosal thickening with a small amount of sinus fluid. Focal left
anterior ethmoid air cell opacification is noted. Mastoid air cells
are clear.

MRA HEAD FINDINGS

Visualized distal vertebral arteries are patent with the right being
mildly dominant. Left PICA origin is patent. Right PICA is not
definitely identified. Right AICA is dominant. SCA origins are
patent. Basilar artery is patent without stenosis. PCAs are
unremarkable. There are small, patent posterior communicating
arteries bilaterally.

Internal carotid arteries are patent from skullbase to carotid
termini. Right right M1 segment is patent. Anterior/superior M2
division appears patent. However, there is diminished flow in and
occlusion of posterior right M2 branches. Left MCA and bilateral
ACAs are unremarkable. Anterior communicating artery is patent. No
intracranial aneurysm is identified.
IMPRESSION: 1. Acute right MCA territory infarct as above, with greatest
involvement of the posterior insula.
2. Occlusion of posterior M2 division branches of the right MCA.

## 2016-04-13 IMAGING — CR DG CHEST 2V
2 series · 2 of 2 positions shown · non-contrast
Comparison: None.

CLINICAL DATA: 27-year-old female with headache and dizziness.
Abnormal right insula. Initial encounter.

EXAM:
CHEST  2 VIEW

[w chest pa]
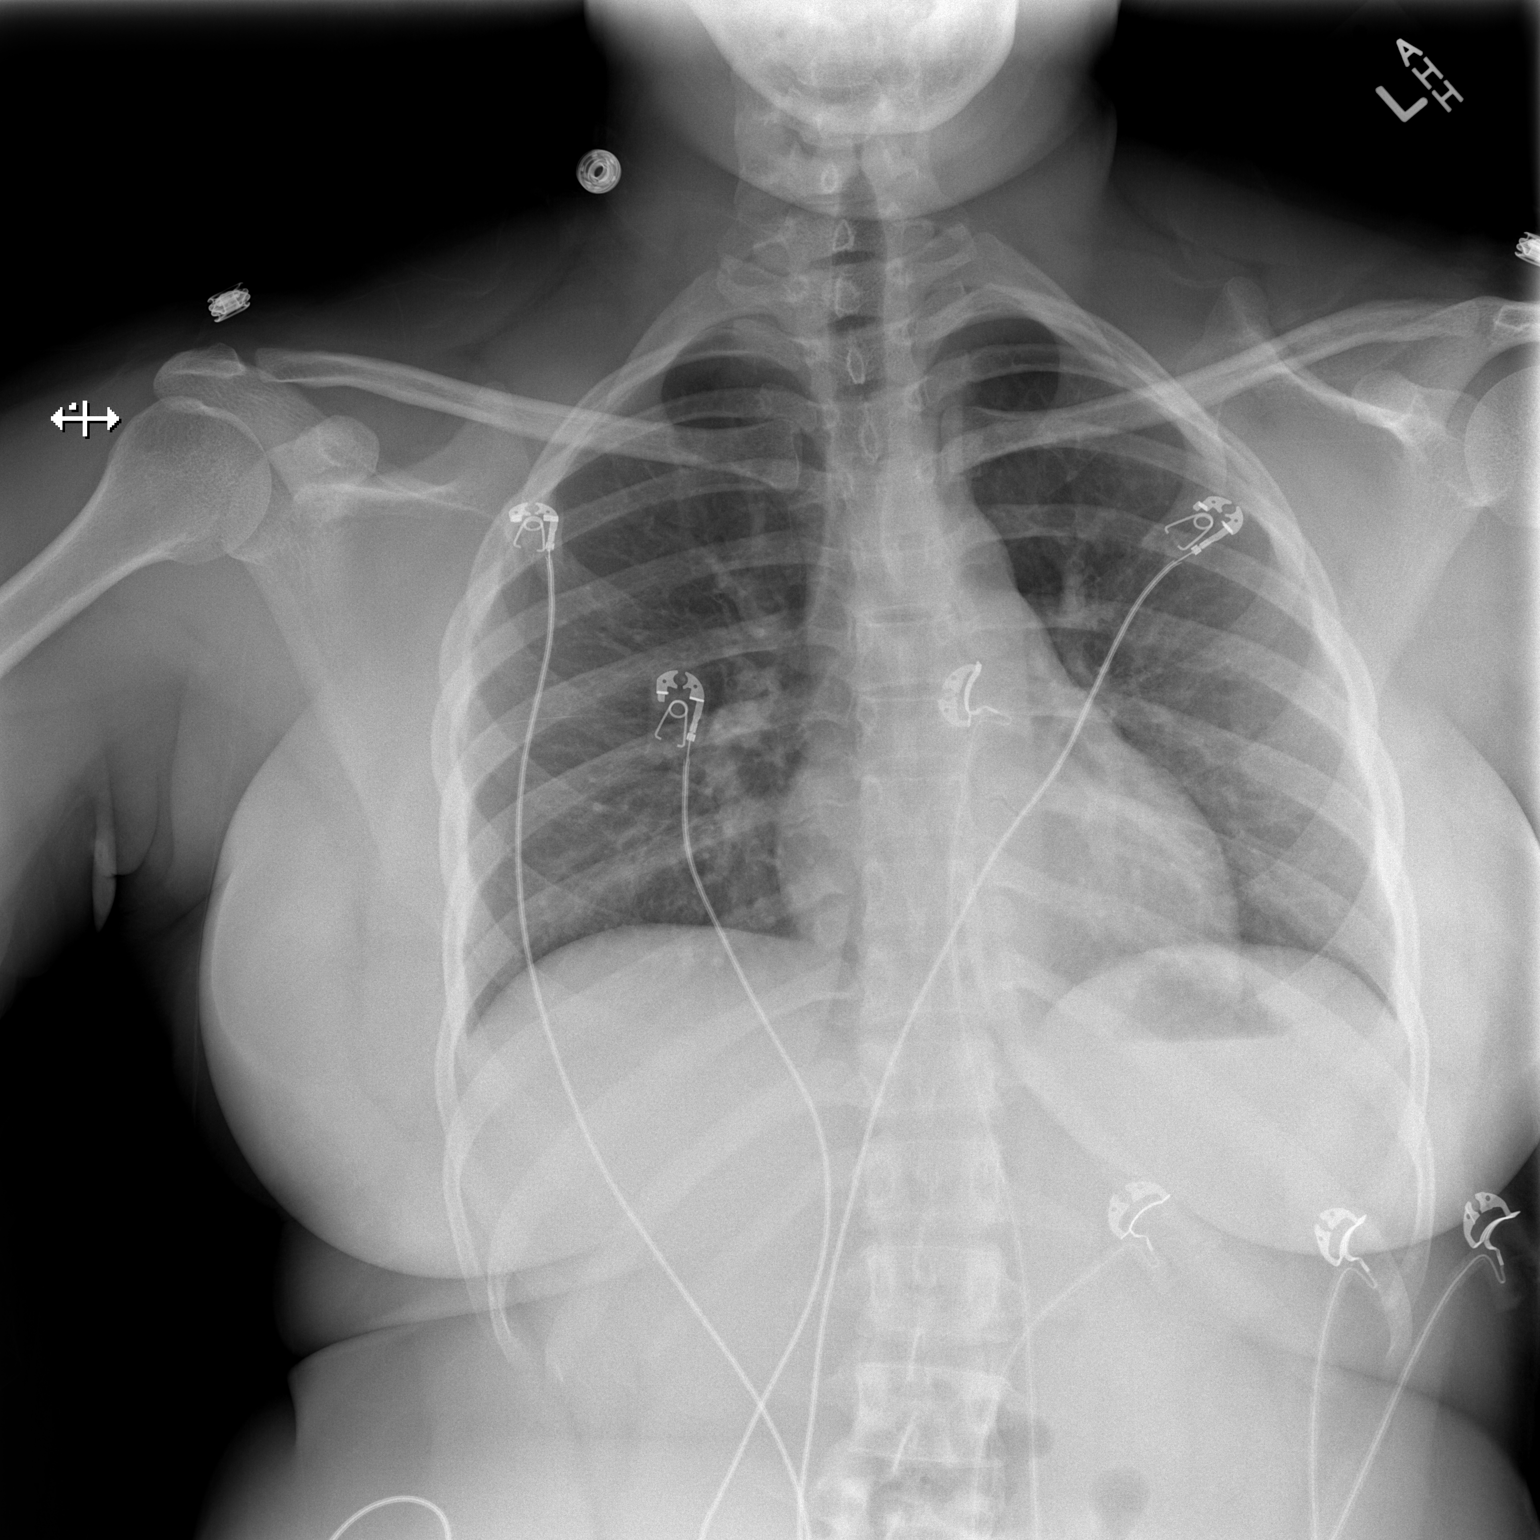

[w chest lat]
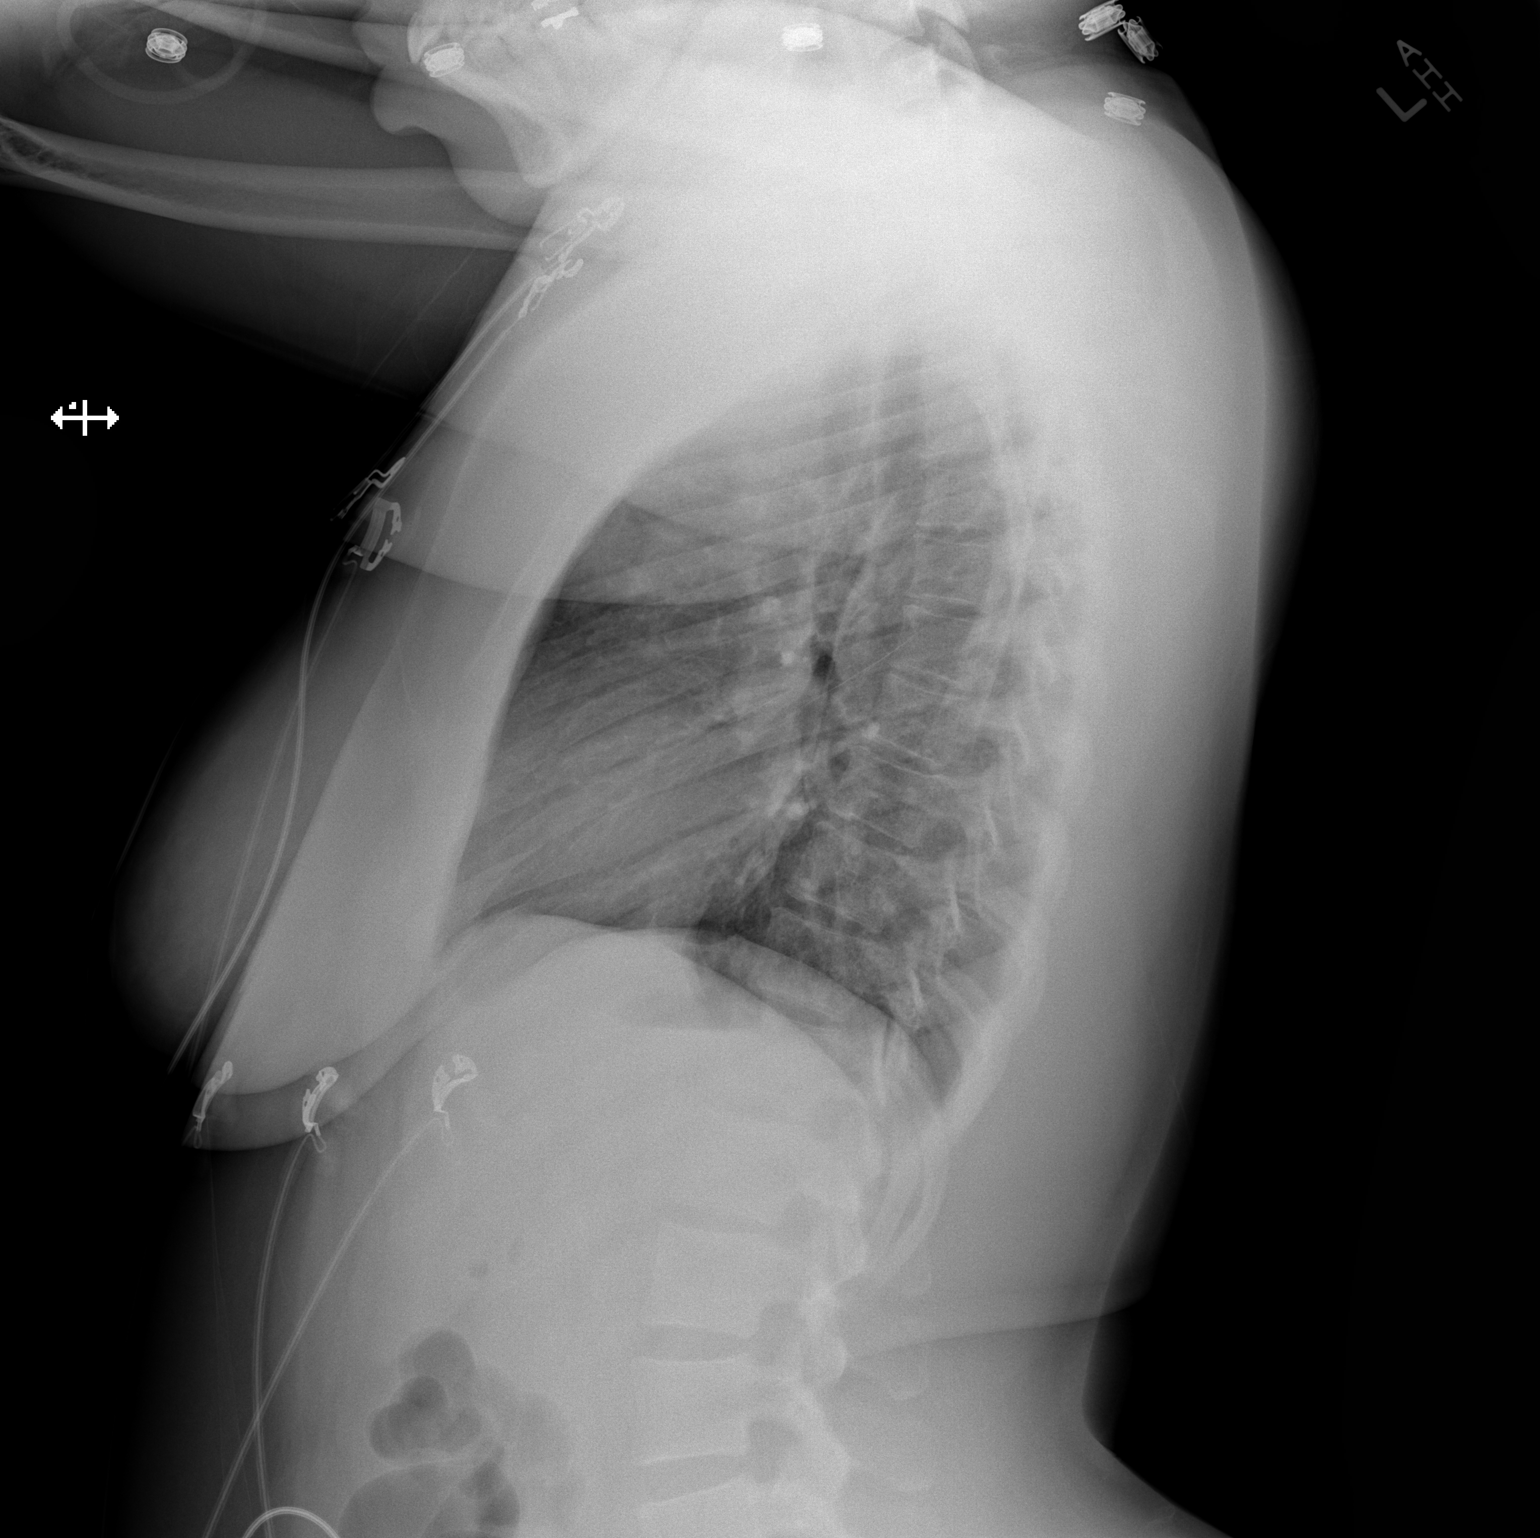

[2 of 2 positions shown; findings below may reference images not displayed]

FINDINGS: Mildly low lung volumes. Normal cardiac size and mediastinal
contours. Visualized tracheal air column is within normal limits.
Other than mild crowding of markings, the lungs are clear. No
pneumothorax or effusion. Except for mild scoliosis negative
visualized osseous structures.
IMPRESSION: Low lung volumes, otherwise negative.
